# Patient Record
Sex: Female | Born: 1990 | Race: White | Hispanic: No | Smoking: Current every day smoker
Health system: Southern US, Community
[De-identification: ages and names within clinical notes are randomized; demographics above are authoritative.]

## PROBLEM LIST (undated history)

## (undated) DIAGNOSIS — D649 Anemia, unspecified: Secondary | ICD-10-CM

## (undated) HISTORY — PX: INNER EAR SURGERY: SHX679

---
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
## 2012-06-09 ENCOUNTER — Emergency Department: Payer: Self-pay | Admitting: Internal Medicine

## 2014-10-12 ENCOUNTER — Encounter: Payer: Self-pay | Admitting: General Practice

## 2014-10-12 ENCOUNTER — Emergency Department
Admission: EM | Admit: 2014-10-12 | Discharge: 2014-10-12 | Disposition: A | Discharge status: Home / Self Care | Payer: Medicaid Other | Attending: Emergency Medicine | Admitting: Emergency Medicine

## 2014-10-12 ENCOUNTER — Emergency Department: Payer: Medicaid Other

## 2014-10-12 DIAGNOSIS — Z3A01 Less than 8 weeks gestation of pregnancy: Secondary | ICD-10-CM | POA: Diagnosis not present

## 2014-10-12 DIAGNOSIS — O200 Threatened abortion: Secondary | ICD-10-CM | POA: Diagnosis not present

## 2014-10-12 DIAGNOSIS — F17210 Nicotine dependence, cigarettes, uncomplicated: Secondary | ICD-10-CM | POA: Diagnosis not present

## 2014-10-12 DIAGNOSIS — O99331 Smoking (tobacco) complicating pregnancy, first trimester: Secondary | ICD-10-CM | POA: Diagnosis not present

## 2014-10-12 DIAGNOSIS — O209 Hemorrhage in early pregnancy, unspecified: Secondary | ICD-10-CM | POA: Diagnosis present

## 2014-10-12 LAB — CBC
HCT: 40.2 % (ref 35.0–47.0)
Hemoglobin: 13.8 g/dL (ref 12.0–16.0)
MCH: 33.4 pg (ref 26.0–34.0)
MCHC: 34.3 g/dL (ref 32.0–36.0)
MCV: 97.4 fL (ref 80.0–100.0)
PLATELETS: 279 10*3/uL (ref 150–440)
RBC: 4.13 MIL/uL (ref 3.80–5.20)
RDW: 13.2 % (ref 11.5–14.5)
WBC: 10.6 10*3/uL (ref 3.6–11.0)

## 2014-10-12 LAB — POCT PREGNANCY, URINE: PREG TEST UR: POSITIVE — AB

## 2014-10-12 LAB — HCG, QUANTITATIVE, PREGNANCY: hCG, Beta Chain, Quant, S: 2923 m[IU]/mL — ABNORMAL HIGH (ref <5)

## 2014-10-12 NOTE — Discharge Instructions (Signed)

## 2014-10-12 NOTE — ED Notes (Signed)
Pt. Arrived to ed from home with reports of experiencing vaginal bleeding since yesterday morning.  Pt verbalized that she is currently [redacted] weeks pregnant. Pt verbalized experiencing lower abdominal cramping. Pt alert and oriented. Pt describes bleeding as " small blood clot"

## 2014-10-12 NOTE — ED Provider Notes (Signed)
[facility] Emergency Department Provider Note     Time seen: ----------------------------------------- 6:59 PM on 10/12/2014 -----------------------------------------    I have reviewed the triage vital signs and the nursing notes.   HISTORY  Chief Complaint Vaginal Bleeding    HPI Mercedes Potter is a 24 y.o. female who presents ER for vaginal bleeding since this morning. Patient states his ladder then a menstrual cycle. This is her second pregnancy, describes lower abdominal cramping is mild. Nothing makes it better or worse. She states [redacted] weeks pregnant, denied bleeding with her prior pregnancy.   History reviewed. No pertinent past medical history.  There are no active problems to display for this patient.   History reviewed. No pertinent past surgical history.  Allergies Review of patient's allergies indicates no known allergies.  Social History History  Substance Use Topics  . Smoking status: Current Every Day Smoker -- 0.50 packs/day    Types: Cigarettes  . Smokeless tobacco: Not on file  . Alcohol Use: No    Review of Systems Constitutional: Negative for fever. Eyes: Negative for visual changes. ENT: Negative for sore throat. Cardiovascular: Negative for chest pain. Respiratory: Negative for shortness of breath. Gastrointestinal: Positive for abdominal cramping Genitourinary: Negative for dysuria. Positive for light vaginal bleeding Musculoskeletal: Negative for back pain. Skin: Negative for rash. Neurological: Negative for headaches, focal weakness or numbness.  10-point ROS otherwise negative.  ____________________________________________   PHYSICAL EXAM:  VITAL SIGNS: ED Triage Vitals  Enc Vitals Group     BP 10/12/14 1740 125/87 mmHg     Pulse Rate 10/12/14 1740 73     Resp 10/12/14 1740 18     Temp --      Temp src --      SpO2 10/12/14 1740 98 %     Weight 10/12/14 1740 108 lb (48.988 kg)     Height  10/12/14 1740  (1.6 m)     Head Cir --      Peak Flow --      Pain Score 10/12/14 1757 3     Pain Loc --      Pain Edu? --      Excl. in GC? --    Constitutional: Alert and oriented. Well appearing and in no distress. Eyes: Conjunctivae are normal. PERRL. Normal extraocular movements. ENT   Head: Normocephalic and atraumatic.   Nose: No congestion/rhinnorhea.   Mouth/Throat: Mucous membranes are moist.   Neck: No stridor. Hematological/Lymphatic/Immunilogical: No cervical lymphadenopathy. Cardiovascular: Normal rate, regular rhythm. Normal and symmetric distal pulses are present in all extremities. No murmurs, rubs, or gallops. Respiratory: Normal respiratory effort without tachypnea nor retractions. Breath sounds are clear and equal bilaterally. No wheezes/rales/rhonchi. Gastrointestinal: Soft and nontender. No distention. No abdominal bruits. There is no CVA tenderness. Musculoskeletal: Nontender with normal range of motion in all extremities. No joint effusions.  No lower extremity tenderness nor edema. Neurologic:  Normal speech and language. No gross focal neurologic deficits are appreciated. Speech is normal. No gait instability. Skin:  Skin is warm, dry and intact. No rash noted. Psychiatric: Mood and affect are normal. Speech and behavior are normal. Patient exhibits appropriate insight and judgment.  ____________________________________________  ED COURSE:  Pertinent labs & imaging results that were available during my care of the patient were reviewed by me and considered in my medical decision making (see chart for details). We'll check Prevacid labs, ultrasound and reevaluate. ____________________________________________    LABS (pertinent positives/negatives)  Labs Reviewed  HCG,  QUANTITATIVE, PREGNANCY - Abnormal; Notable for the following:    hCG, Beta Chain, Quant, S 2923 (*)    All other components within normal limits  POCT PREGNANCY, URINE -  Abnormal; Notable for the following:    Preg Test, Ur POSITIVE (*)    All other components within normal limits  CBC   Blood type is A+ RADIOLOGY Transvaginal ultrasound IMPRESSION: Findings are suspicious but not yet definitive for failed pregnancy. Recommend follow-up US in 10-14 days for definitive diagnosis. This recommendation follows SRU consensus guidelines: Diagnostic Criteria for Nonviable Pregnancy Early in the First Trimester. Malva Limes Engl J Med 2013; 161:[id number]:1443-51.  ____________________________________________  FINAL ASSESSMENT AND PLAN  Threatened miscarriage  Plan: Patient will continue outpatient follow-up with OB/GYN for reevaluation and repeat ultrasound.. She is advised to return for worsening or worrisome symptoms. She was given proper follow-up precautions. This is likely a failed pregnancy.   Emily FilbertWilliams, Bradyn Vassey E, MD   Emily FilbertJonathan E Tushar Enns, MD 10/12/14 [id number]

## 2015-04-04 ENCOUNTER — Inpatient Hospital Stay (HOSPITAL_COMMUNITY)
Admission: AD | Admit: 2015-04-04 | Discharge: 2015-04-04 | Disposition: A | Discharge status: Home / Self Care | Payer: Medicaid Other | Source: Ambulatory Visit | Attending: Obstetrics & Gynecology | Admitting: Obstetrics & Gynecology

## 2015-04-04 ENCOUNTER — Inpatient Hospital Stay (HOSPITAL_COMMUNITY): Payer: Medicaid Other

## 2015-04-04 ENCOUNTER — Encounter (HOSPITAL_COMMUNITY): Payer: Self-pay

## 2015-04-04 ENCOUNTER — Inpatient Hospital Stay (EMERGENCY_DEPARTMENT_HOSPITAL)
Admission: AD | Admit: 2015-04-04 | Discharge: 2015-04-04 | Disposition: A | Discharge status: Home / Self Care | Payer: Medicaid Other | Source: Ambulatory Visit | Attending: Obstetrics & Gynecology | Admitting: Obstetrics & Gynecology

## 2015-04-04 DIAGNOSIS — O0000 Abdominal pregnancy without intrauterine pregnancy: Secondary | ICD-10-CM | POA: Diagnosis not present

## 2015-04-04 DIAGNOSIS — Z3A01 Less than 8 weeks gestation of pregnancy: Secondary | ICD-10-CM | POA: Insufficient documentation

## 2015-04-04 DIAGNOSIS — O009 Unspecified ectopic pregnancy without intrauterine pregnancy: Secondary | ICD-10-CM

## 2015-04-04 DIAGNOSIS — O26851 Spotting complicating pregnancy, first trimester: Secondary | ICD-10-CM

## 2015-04-04 DIAGNOSIS — O209 Hemorrhage in early pregnancy, unspecified: Secondary | ICD-10-CM | POA: Insufficient documentation

## 2015-04-04 DIAGNOSIS — O3680X0 Pregnancy with inconclusive fetal viability, not applicable or unspecified: Secondary | ICD-10-CM

## 2015-04-04 HISTORY — DX: Anemia, unspecified: D64.9

## 2015-04-04 LAB — WET PREP, GENITAL
Clue Cells Wet Prep HPF POC: NONE SEEN
SPERM: NONE SEEN
Trich, Wet Prep: NONE SEEN
Yeast Wet Prep HPF POC: NONE SEEN

## 2015-04-04 LAB — URINE MICROSCOPIC-ADD ON

## 2015-04-04 LAB — URINALYSIS, ROUTINE W REFLEX MICROSCOPIC
Bilirubin Urine: NEGATIVE
Bilirubin Urine: NEGATIVE
GLUCOSE, UA: NEGATIVE mg/dL
Glucose, UA: NEGATIVE mg/dL
Ketones, ur: NEGATIVE mg/dL
Ketones, ur: 15 mg/dL — AB
LEUKOCYTES UA: NEGATIVE
LEUKOCYTES UA: NEGATIVE
Nitrite: NEGATIVE
Nitrite: NEGATIVE
PH: 6 (ref 5.0–8.0)
PROTEIN: 30 mg/dL — AB
Protein, ur: 30 mg/dL — AB
Specific Gravity, Urine: >1.03 — ABNORMAL HIGH (ref 1.005–1.030)
pH: 6 (ref 5.0–8.0)

## 2015-04-04 LAB — CBC
HCT: 37.7 % (ref 36.0–46.0)
HEMATOCRIT: 37.9 % (ref 36.0–46.0)
HEMOGLOBIN: 12.9 g/dL (ref 12.0–15.0)
Hemoglobin: 12.8 g/dL (ref 12.0–15.0)
MCH: 32.7 pg (ref 26.0–34.0)
MCH: 32.8 pg (ref 26.0–34.0)
MCHC: 34 g/dL (ref 30.0–36.0)
MCHC: 34 g/dL (ref 30.0–36.0)
MCV: 95.9 fL (ref 78.0–100.0)
MCV: 96.7 fL (ref 78.0–100.0)
PLATELETS: 332 10*3/uL (ref 150–400)
Platelets: 315 10*3/uL (ref 150–400)
RBC: 3.95 MIL/uL (ref 3.87–5.11)
RBC: 3.9 MIL/uL (ref 3.87–5.11)
RDW: 12.7 % (ref 11.5–15.5)
RDW: 12.4 % (ref 11.5–15.5)
WBC: 17.3 10*3/uL — ABNORMAL HIGH (ref 4.0–10.5)
WBC: 9.1 10*3/uL (ref 4.0–10.5)

## 2015-04-04 LAB — COMPREHENSIVE METABOLIC PANEL
ALK PHOS: 65 U/L (ref 38–126)
ALT: 17 U/L (ref 14–54)
AST: 20 U/L (ref 15–41)
Albumin: 4.3 g/dL (ref 3.5–5.0)
Anion gap: 8 (ref 5–15)
BILIRUBIN TOTAL: 0.3 mg/dL (ref 0.3–1.2)
BUN: 12 mg/dL (ref 6–20)
CHLORIDE: 102 mmol/L (ref 101–111)
CO2: 27 mmol/L (ref 22–32)
Calcium: 8.9 mg/dL (ref 8.9–10.3)
Creatinine, Ser: 0.56 mg/dL (ref 0.44–1.00)
GFR calc non Af Amer: >60 mL/min (ref >60)
Glucose, Bld: 100 mg/dL — ABNORMAL HIGH (ref 65–99)
Potassium: 3.5 mmol/L (ref 3.5–5.1)
Sodium: 137 mmol/L (ref 135–145)
Total Protein: 7.3 g/dL (ref 6.5–8.1)

## 2015-04-04 LAB — HCG, QUANTITATIVE, PREGNANCY
hCG, Beta Chain, Quant, S: 642 m[IU]/mL — ABNORMAL HIGH (ref <5)
hCG, Beta Chain, Quant, S: 661 m[IU]/mL — ABNORMAL HIGH (ref <5)

## 2015-04-04 LAB — RAPID URINE DRUG SCREEN, HOSP PERFORMED
AMPHETAMINES: NOT DETECTED
BARBITURATES: NOT DETECTED
BENZODIAZEPINES: NOT DETECTED
Cocaine: NOT DETECTED
OPIATES: NOT DETECTED
TETRAHYDROCANNABINOL: POSITIVE — AB

## 2015-04-04 LAB — HIV ANTIBODY (ROUTINE TESTING W REFLEX): HIV SCREEN 4TH GENERATION: NONREACTIVE

## 2015-04-04 LAB — POCT PREGNANCY, URINE: Preg Test, Ur: POSITIVE — AB

## 2015-04-04 LAB — ABO/RH: ABO/RH(D): A POS

## 2015-04-04 MED ORDER — METHOTREXATE INJECTION FOR WOMEN'S HOSPITAL
50.0000 mg/m2 | Freq: Once | INTRAMUSCULAR | Status: AC
  Administered 2015-04-04: 75 mg via INTRAMUSCULAR
  Filled 2015-04-04: qty 1.5

## 2015-04-04 NOTE — Discharge Instructions (Signed)
Methotrexate Treatment for an Ectopic Pregnancy, Care After Refer to this sheet in the next few weeks. These instructions provide you with information on caring for yourself after your procedure. Your health care provider may also give you more specific instructions. Your treatment has been planned according to current medical practices, but problems sometimes occur. Call your health care provider if you have any problems or questions after your procedure. WHAT TO EXPECT AFTER THE PROCEDURE You may have some abdominal cramping, vaginal bleeding, and fatigue in the first few days after taking methotrexate. Some other possible side effects of methotrexate include:  Nausea.  Vomiting.  Diarrhea.  Mouth sores.  Swelling or irritation of the lining of your lungs (pneumonitis).  Liver damage.  Hair loss. HOME CARE INSTRUCTIONS  After you have received the methotrexate medicine, you need to be careful of your activities and watch your condition for several weeks. It may take 1 week before your hormone levels return to normal.  Keep all follow-up appointments as directed by your health care provider.  Avoid traveling too far away from your health care provider.  Do not have sexual intercourse until your health care provider says it is safe to do so.  You may resume your usual diet.  Limit strenuous activity.  Do not take folic acid, prenatal vitamins, or other vitamins that contain folic acid.  Do not take aspirin, ibuprofen, or naproxen (nonsteroidal anti-inflammatory drugs [NSAIDs]).  Do not drink alcohol. SEEK MEDICAL CARE IF:   You cannot control your nausea and vomiting.  You cannot control your diarrhea.  You have sores in your mouth and want treatment.  You need pain medicine for your abdominal pain.  You have a rash.  You are having a reaction to the medicine. SEEK IMMEDIATE MEDICAL CARE IF:   You have increasing abdominal or pelvic pain.  You notice increased  bleeding.  You feel light-headed, or you faint.  You have shortness of breath.  Your heart rate increases.  You have a cough.  You have chills.  You have a fever.   This information is not intended to replace advice given to you by your health care provider. Make sure you discuss any questions you have with your health care provider.   Document Released: 03/23/2011 Document Revised: 04/08/2013 Document Reviewed: 01/20/2013 Elsevier Interactive Patient Education 2016 [employer]   Ectopic Pregnancy An ectopic pregnancy is when the fertilized egg attaches (implants) outside the uterus. Most ectopic pregnancies occur in the fallopian tube. Rarely do ectopic pregnancies occur on the ovary, intestine, pelvis, or cervix. In an ectopic pregnancy, the fertilized egg does not have the ability to develop into a normal, healthy baby.  A ruptured ectopic pregnancy is one in which the fallopian tube gets torn or bursts and results in internal bleeding. Often there is intense abdominal pain, and sometimes, vaginal bleeding. Having an ectopic pregnancy can be life threatening. If left untreated, this dangerous condition can lead to a blood transfusion, abdominal surgery, or even death. CAUSES  Damage to the fallopian tubes is the suspected cause in most ectopic pregnancies.  RISK FACTORS Depending on your circumstances, the risk of having an ectopic pregnancy will vary. The level of risk can be divided into three categories. High Risk  You have gone through infertility treatment.  You have had a previous ectopic pregnancy.  You have had previous tubal surgery.  You have had previous surgery to have the fallopian tubes tied (tubal ligation).  You have tubal problems or  diseases.  You have been exposed to DES. DES is a medicine that was used until 1971 and had effects on babies whose mothers took the medicine.  You become pregnant while using an intrauterine device (IUD) for birth  control. Moderate Risk  You have a history of infertility.  You have a history of a sexually transmitted infection (STI).  You have a history of pelvic inflammatory disease (PID).  You have scarring from endometriosis.  You have multiple sexual partners.  You smoke. Low Risk  You have had previous pelvic surgery.  You use vaginal douching.  You became sexually active before 24 years of age. SIGNS AND SYMPTOMS  An ectopic pregnancy should be suspected in anyone who has missed a period and has abdominal pain or bleeding.  You may experience normal pregnancy symptoms, such as:  Nausea.  Tiredness.  Breast tenderness.  Other symptoms may include:  Pain with intercourse.  Irregular vaginal bleeding or spotting.  Cramping or pain on one side or in the lower abdomen.  Fast heartbeat.  Passing out while having a bowel movement.  Symptoms of a ruptured ectopic pregnancy and internal bleeding may include:  Sudden, severe pain in the abdomen and pelvis.  Dizziness or fainting.  Pain in the shoulder area. DIAGNOSIS  Tests that may be performed include:  A pregnancy test.  An ultrasound test.  Testing the specific level of pregnancy hormone in the bloodstream.  Taking a sample of uterus tissue (dilation and curettage, D&C).  Surgery to perform a visual exam of the inside of the abdomen using a thin, lighted tube with a tiny camera on the end (laparoscope). TREATMENT  An injection of a medicine called methotrexate may be given. This medicine causes the pregnancy tissue to be absorbed. It is given if:  The diagnosis is made early.  The fallopian tube has not ruptured.  You are considered to be a good candidate for the medicine. Usually, pregnancy hormone blood levels are checked after methotrexate treatment. This is to be sure the medicine is effective. It may take 4-6 weeks for the pregnancy to be absorbed (though most pregnancies will be absorbed by 3  weeks). Surgical treatment may be needed. A laparoscope may be used to remove the pregnancy tissue. If severe internal bleeding occurs, a cut (incision) may be made in the lower abdomen (laparotomy), and the ectopic pregnancy is removed. This stops the bleeding. Part of the fallopian tube, or the whole tube, may be removed as well (salpingectomy). After surgery, pregnancy hormone tests may be done to be sure there is no pregnancy tissue left. You may receive a Rho (D) immune globulin shot if you are Rh negative and the father is Rh positive, or if you do not know the Rh type of the father. This is to prevent problems with any future pregnancy. SEEK IMMEDIATE MEDICAL CARE IF:  You have any symptoms of an ectopic pregnancy. This is a medical emergency. MAKE SURE YOU:  Understand these instructions.  Will watch your condition.  Will get help right away if you are not doing well or get worse.   This information is not intended to replace advice given to you by your health care provider. Make sure you discuss any questions you have with your health care provider.   Document Released: 05/11/2004 Document Revised: 04/24/2014 Document Reviewed: 10/31/2012 Elsevier Interactive Patient Education [employer]

## 2015-04-04 NOTE — MAU Provider Note (Signed)
History     CSN: [id number]  Arrival date and time: 04/04/15 2012   First Provider Initiated Contact with Patient 04/04/15 2057      Chief Complaint  Patient presents with  . Vaginal Bleeding  . Abdominal Pain   HPI Comments: Mercedes Potter is a 24 y.o. G3P0011 at [redacted]w[redacted]d who presents today with vaginal bleeding and worsening abdominal pain. She was seen earlier today, and dx with pregnancy of unknown location. She states that when she was here earlier she was having only spotting, but that the bleeding and pain have gotten worse throughout the day.   Vaginal Bleeding The patient's primary symptoms include pelvic pain and vaginal bleeding. This is a new problem. The current episode started yesterday. The problem occurs constantly. The problem has been gradually worsening. The pain is moderate. The problem affects both sides. She is pregnant. Associated symptoms include abdominal pain. Pertinent negatives include no chills, constipation, diarrhea, dysuria, fever, frequency, nausea, urgency or vomiting. The vaginal discharge was bloody. The vaginal bleeding is lighter than menses. She has not been passing clots. She has not been passing tissue. Nothing aggravates the symptoms. She has tried nothing for the symptoms. She is sexually active. She uses nothing for contraception.     Past Medical History  Diagnosis Date  . Anemia     Past Surgical History  Procedure Laterality Date  . Inner ear surgery Right     Family History  Problem Relation Age of Onset  . Hypertension Father     Social History  Substance Use Topics  . Smoking status: Current Every Day Smoker -- 0.50 packs/day    Types: Cigarettes  . Smokeless tobacco: None  . Alcohol Use: Yes     Comment: Wine on 04/04/2015 had home UPT    Allergies:  Allergies  Allergen Reactions  . Latex Itching  . Nickel Rash    Prescriptions prior to admission  Medication Sig Dispense Refill Last Dose  . Prenatal Vit-Fe  Fumarate-FA (PRENATAL MULTIVITAMIN) TABS tablet Take 1 tablet by mouth daily.    Past Week at Unknown time    Review of Systems  Constitutional: Negative for fever and chills.  Gastrointestinal: Positive for abdominal pain. Negative for nausea, vomiting, diarrhea and constipation.  Genitourinary: Positive for vaginal bleeding and pelvic pain. Negative for dysuria, urgency and frequency.   Physical Exam   Blood pressure 106/74, pulse 78, temperature 97.6 F (36.4 C), temperature source Oral, resp. rate 16, height  (1.575 m), weight 48.626 kg (107 lb 3.2 oz), last menstrual period 02/15/2015, SpO2 98 %.  Physical Exam  Nursing note and vitals reviewed. Constitutional: She is oriented to person, place, and time. She appears well-developed and well-nourished. No distress.  HENT:  Head: Normocephalic.  Cardiovascular: Normal rate.   Respiratory: Effort normal.  GI: Soft. There is no tenderness. There is no rebound.  Neurological: She is alert and oriented to person, place, and time.  Skin: Skin is warm and dry.  Psychiatric: She has a normal mood and affect.   Results for orders placed or performed during the hospital encounter of 04/04/15 (from the past 24 hour(s))  Urinalysis, Routine w reflex microscopic (not at [facility])     Status: Abnormal   Collection Time: 04/04/15  8:40 PM  Result Value Ref Range   Color, Urine YELLOW YELLOW   APPearance CLEAR CLEAR   Specific Gravity, Urine >1.030 (H) 1.005 - 1.030   pH 6.0 5.0 - 8.0   Glucose, UA  NEGATIVE NEGATIVE mg/dL   Hgb urine dipstick LARGE (A) NEGATIVE   Bilirubin Urine NEGATIVE NEGATIVE   Ketones, ur 15 (A) NEGATIVE mg/dL   Protein, ur 30 (A) NEGATIVE mg/dL   Nitrite NEGATIVE NEGATIVE   Leukocytes, UA NEGATIVE NEGATIVE  Urine microscopic-add on     Status: Abnormal   Collection Time: 04/04/15  8:40 PM  Result Value Ref Range   Squamous Epithelial / LPF 0-5 (A) NONE SEEN   WBC, UA 6-30 0 - 5 WBC/hpf   RBC / HPF 0-5 0 - 5  RBC/hpf   Bacteria, UA RARE (A) NONE SEEN   Urine-Other MUCOUS PRESENT   hCG, quantitative, pregnancy     Status: Abnormal   Collection Time: 04/04/15  9:04 PM  Result Value Ref Range   hCG, Beta Chain, Quant, S 661 (H) <5 mIU/mL  CBC     Status: None   Collection Time: 04/04/15  9:04 PM  Result Value Ref Range   WBC 9.1 4.0 - 10.5 K/uL   RBC 3.90 3.87 - 5.11 MIL/uL   Hemoglobin 12.8 12.0 - 15.0 g/dL   HCT 81.1 91.4 - 78.2 %   MCV 96.7 78.0 - 100.0 fL   MCH 32.8 26.0 - 34.0 pg   MCHC 34.0 30.0 - 36.0 g/dL   RDW 95.6 21.3 - 08.6 %   Platelets 332 150 - 400 K/uL   US Ob Comp Less 14 Wks  04/04/2015  CLINICAL DATA:  Spotting. Positive urine pregnancy test. Estimated gestational age by LMP is 6 weeks 6 days. Quantitative beta HCG is 642. EXAM: OBSTETRIC <14 WK [place] AND TRANSVAGINAL OB US TECHNIQUE: Both transabdominal and transvaginal ultrasound examinations were performed for complete evaluation of the gestation as well as the maternal uterus, adnexal regions, and pelvic cul-de-sac. Transvaginal technique was performed to assess early pregnancy. COMPARISON:  None. FINDINGS: Intrauterine gestational sac: No definitive intrauterine gestational sac identified. There is a tiny cystic collection located eccentrically within a distended fluid-filled endometrium. Yolk sac:  Not identified. Embryo:  Not identified. Cardiac Activity: Not identified. Maternal uterus/adnexae: Uterus: The uterus is anteverted. The endometrium is expanded with complex fluid or hemorrhage measuring about 3.2 cm in thickness. There is a tiny eccentric cystic collection which could possibly represent an early intrauterine gestational sac but is not definitive for gestational sac. No fluid in the cervix. No myometrial mass lesions. Right ovary: Right ovary measures 3.2 x 1.9 x 1.8 cm Normal size and normal follicular changes. There is a dilated tubular structure in the right adnexa filled with complex fluid with internal echoes.  This suggest study dilated fallopian tube possibly a pyosalpinx or hydrosalpinx. Left ovary: Left ovary measures 2.9 x 2.6 x 2.4 cm. There is an ovarian cyst measuring about 2 cm diameter with prominent surrounding flow on color flow Doppler imaging consistent with a corpus luteal cyst. In the left adnexa, there is a circumscribed ovoid hyperechoic solid-appearing structure measuring 1.1 x 1.8 x 1.1 cm. Small amount of flow is demonstrated around the structure. This could represent an early ectopic pregnancy. No free fluid in the pelvis. IMPRESSION: No definitive intrauterine gestational sac is identified. Heterogeneous probably hemorrhagic fluid collection within the endometrium. Tiny eccentric cystic collection could conceivably represent an early gestational sac. Possible pyosalpinx on the right. Corpus luteal cyst on the left ovary with left adnexal solid appearing structure which could represent early ectopic pregnancy. Suggest follow-up ultrasound in 10-14 days and follow-up with serial beta HCG for further evaluation. These results were  called by telephone at the time of interpretation on 04/04/2015 at 6:18 am to Dr. Georges MouseNATALIE FRAZIER , who verbally acknowledged these results. Electronically Signed   By: Burman NievesWilliam  Stevens M.D.   On: 04/04/2015 06:21   [place] Ob Transvaginal  04/04/2015  CLINICAL DATA:  24 year old pregnant female with spotting and pain. EXAM: OBSTETRIC <14 WK US AND TRANSVAGINAL OB US TECHNIQUE: Transvaginal ultrasound examination was performed for complete evaluation of the gestation as well as the maternal uterus, adnexal regions, and pelvic cul-de-sac. COMPARISON:  Pelvic ultrasound dated earlier on 04/04/2015 FINDINGS: The uterus is anteverted. No intrauterine pregnancy identified. Complex appearing echogenic material within the endometrial canal again noted which may represent hemorrhagic fluid or proteinaceous debris. The right ovary measures 3.5 x 1.5 x 2.1 cm and appears unremarkable. A  tubular appearing structure with intraluminal low-level echogenic debris again noted, which may represent pyosalpinx. The left ovary measures 3.4 x 2.6 x 2.6 cm. A corpus luteum is noted within the left ovary. There is a 2.5 x 1.6 x 1.6 cm solid-appearing echogenic structure adjacent to the left ovary. Doppler images demonstrate presence of internal flow within this structure. This is structures appear to be separate from the left ovary on the cine compression images and is concerning for ectopic pregnancy. No free fluid within the pelvis. IMPRESSION: No intrauterine pregnancy identified. Solid-appearing structure adjacent to the left ovary concerning for an ectopic pregnancy. Clinical correlation and obstetrical consult is advised. Critical Value/emergent results were called by telephone at the time of interpretation on 04/04/2015 at 10:07 pm to nurse practitioner Thressa ShellerHEATHER Towanna Avery , who verbally acknowledged these results. Electronically Signed   By: Elgie CollardArash  Radparvar M.D.   On: 04/04/2015 22:14   [place] Ob Transvaginal  04/04/2015  CLINICAL DATA:  Spotting. Positive urine pregnancy test. Estimated gestational age by LMP is 6 weeks 6 days. Quantitative beta HCG is 642. EXAM: OBSTETRIC <14 WK US AND TRANSVAGINAL OB US TECHNIQUE: Both transabdominal and transvaginal ultrasound examinations were performed for complete evaluation of the gestation as well as the maternal uterus, adnexal regions, and pelvic cul-de-sac. Transvaginal technique was performed to assess early pregnancy. COMPARISON:  None. FINDINGS: Intrauterine gestational sac: No definitive intrauterine gestational sac identified. There is a tiny cystic collection located eccentrically within a distended fluid-filled endometrium. Yolk sac:  Not identified. Embryo:  Not identified. Cardiac Activity: Not identified. Maternal uterus/adnexae: Uterus: The uterus is anteverted. The endometrium is expanded with complex fluid or hemorrhage measuring about 3.2 cm in  thickness. There is a tiny eccentric cystic collection which could possibly represent an early intrauterine gestational sac but is not definitive for gestational sac. No fluid in the cervix. No myometrial mass lesions. Right ovary: Right ovary measures 3.2 x 1.9 x 1.8 cm Normal size and normal follicular changes. There is a dilated tubular structure in the right adnexa filled with complex fluid with internal echoes. This suggest study dilated fallopian tube possibly a pyosalpinx or hydrosalpinx. Left ovary: Left ovary measures 2.9 x 2.6 x 2.4 cm. There is an ovarian cyst measuring about 2 cm diameter with prominent surrounding flow on color flow Doppler imaging consistent with a corpus luteal cyst. In the left adnexa, there is a circumscribed ovoid hyperechoic solid-appearing structure measuring 1.1 x 1.8 x 1.1 cm. Small amount of flow is demonstrated around the structure. This could represent an early ectopic pregnancy. No free fluid in the pelvis. IMPRESSION: No definitive intrauterine gestational sac is identified. Heterogeneous probably hemorrhagic fluid collection within the endometrium. Tiny eccentric cystic  collection could conceivably represent an early gestational sac. Possible pyosalpinx on the right. Corpus luteal cyst on the left ovary with left adnexal solid appearing structure which could represent early ectopic pregnancy. Suggest follow-up ultrasound in 10-14 days and follow-up with serial beta HCG for further evaluation. These results were called by telephone at the time of interpretation on 04/04/2015 at 6:18 am to Dr. Georges Mouse , who verbally acknowledged these results. Electronically Signed   By: Burman Nieves M.D.   On: 04/04/2015 06:21    MAU Course  Procedures  MDM 2219: D/W Dr. Despina Hidden. Will give MTX D/W the patient at length. Reviewed options. Patient agreeable to MTX at this time. Questions answered.   Assessment and Plan   1. EP (ectopic pregnancy)    DC home A POS MTX  given today  Bleeding precautions Ectopic precautions RX: none  Return to MAU as needed FU with OB as planned  Follow-up Information    Follow up with THE [facility] [place] [facility]ERN[facility] ADMISSIONS.   Why:  Wednesday 04/07/15 for repeat blood work   Contact information:   [address] [id number] [place]he[place] Bon[place] [address] [phone]        Tawnya Crook 04/04/2015, 9:11 PM

## 2015-04-04 NOTE — MAU Provider Note (Signed)
History     CSN: [id number]  Arrival date and time: 04/04/15 0401   None     Chief Complaint  Patient presents with  . Dizziness   HPI 24 y.o. G2P0010 at [redacted] weeks EGA w/ reported presyncopal episode. Patient was brought in by EMS. States she was at a friend's house, had some wine, felt nauseous, got up to go to the bathroom and felt hot and dizzy, her partner states she may have fainted for about 10-15 seconds, was fine once she sat down, sat for a few minutes then got up to walk to car and had another possible syncopal episode. Patient states she ate at [facility] about 6 hours prior to episode, no food since, hasn't had much fluid intake for the past day.   Past Medical History  Diagnosis Date  . Anemia     Past Surgical History  Procedure Laterality Date  . Inner ear surgery Right     Family History  Problem Relation Age of Onset  . Hypertension Father     Social History  Substance Use Topics  . Smoking status: Current Every Day Smoker -- 0.50 packs/day    Types: Cigarettes  . Smokeless tobacco: None  . Alcohol Use: Yes     Comment: Wine on 04/04/2015 had home UPT    Allergies:  Allergies  Allergen Reactions  . Nickel Rash    Prescriptions prior to admission  Medication Sig Dispense Refill Last Dose  . Prenatal Vit-Fe Fumarate-FA (PRENATAL MULTIVITAMIN) TABS tablet Take 1 tablet by mouth daily at 12 noon.   04/03/2015 at Unknown time    Review of Systems  Constitutional: Negative.   Respiratory: Negative.   Cardiovascular: Negative.   Gastrointestinal: Positive for nausea. Negative for vomiting, abdominal pain, diarrhea and constipation.  Genitourinary: Negative for dysuria, urgency, frequency, hematuria and flank pain.       + spotting   Musculoskeletal: Negative.   Neurological: Positive for dizziness and loss of consciousness.  Psychiatric/Behavioral: Negative.    Physical Exam   Blood pressure 108/66, pulse 98, temperature 97.5 F (36.4 C),  temperature source Axillary, resp. rate 16, last menstrual period 02/15/2015, SpO2 100 %.  Physical Exam  Nursing note and vitals reviewed. Constitutional: She is oriented to person, place, and time. She appears well-developed. No distress.  Thin   HENT:  Head: Normocephalic and atraumatic.  Cardiovascular: Normal rate.   Respiratory: Effort normal. No respiratory distress.  GI: Soft. Bowel sounds are normal. She exhibits no distension and no mass. There is no tenderness. There is no rebound and no guarding.  Genitourinary: There is no rash or lesion on the right labia. There is no rash or lesion on the left labia. Uterus is not deviated, not enlarged, not fixed and not tender. Cervix exhibits no motion tenderness, no discharge and no friability. Right adnexum displays no mass, no tenderness and no fullness. Left adnexum displays no mass, no tenderness and no fullness. There is bleeding (small) in the vagina. No erythema or tenderness in the vagina. No vaginal discharge found.  Neurological: She is alert and oriented to person, place, and time.  Skin: Skin is warm and dry.  Psychiatric: She has a normal mood and affect.    MAU Course  Procedures  Results for orders placed or performed during the hospital encounter of 04/04/15 (from the past 24 hour(s))  CBC     Status: Abnormal   Collection Time: 04/04/15  4:25 AM  Result Value Ref Range  WBC 17.3 (H) 4.0 - 10.5 K/uL   RBC 3.95 3.87 - 5.11 MIL/uL   Hemoglobin 12.9 12.0 - 15.0 g/dL   HCT 16.1 09.6 - 04.5 %   MCV 95.9 78.0 - 100.0 fL   MCH 32.7 26.0 - 34.0 pg   MCHC 34.0 30.0 - 36.0 g/dL   RDW 40.9 81.1 - 91.4 %   Platelets 315 150 - 400 K/uL  Comprehensive metabolic panel     Status: Abnormal   Collection Time: 04/04/15  4:25 AM  Result Value Ref Range   Sodium 137 135 - 145 mmol/L   Potassium 3.5 3.5 - 5.1 mmol/L   Chloride 102 101 - 111 mmol/L   CO2 27 22 - 32 mmol/L   Glucose, Bld 100 (H) 65 - 99 mg/dL   BUN 12 6 - 20  mg/dL   Creatinine, Ser 7.82 0.44 - 1.00 mg/dL   Calcium 8.9 8.9 - 95.6 mg/dL   Total Protein 7.3 6.5 - 8.1 g/dL   Albumin 4.3 3.5 - 5.0 g/dL   AST 20 15 - 41 U/L   ALT 17 14 - 54 U/L   Alkaline Phosphatase 65 38 - 126 U/L   Total Bilirubin 0.3 0.3 - 1.2 mg/dL   GFR calc non Af Amer >60 >60 mL/min   GFR calc Af Amer >60 >60 mL/min   Anion gap 8 5 - 15  Pregnancy, urine POC     Status: Abnormal   Collection Time: 04/04/15  4:25 AM  Result Value Ref Range   Preg Test, Ur POSITIVE (A) NEGATIVE  Urinalysis, Routine w reflex microscopic (not at [facility])     Status: Abnormal   Collection Time: 04/04/15  4:30 AM  Result Value Ref Range   Color, Urine YELLOW YELLOW   APPearance CLEAR CLEAR   Specific Gravity, Urine >1.030 (H) 1.005 - 1.030   pH 6.0 5.0 - 8.0   Glucose, UA NEGATIVE NEGATIVE mg/dL   Hgb urine dipstick LARGE (A) NEGATIVE   Bilirubin Urine NEGATIVE NEGATIVE   Ketones, ur NEGATIVE NEGATIVE mg/dL   Protein, ur 30 (A) NEGATIVE mg/dL   Nitrite NEGATIVE NEGATIVE   Leukocytes, UA NEGATIVE NEGATIVE  Urine rapid drug screen (hosp performed)     Status: Abnormal   Collection Time: 04/04/15  4:30 AM  Result Value Ref Range   Opiates NONE DETECTED NONE DETECTED   Cocaine NONE DETECTED NONE DETECTED   Benzodiazepines NONE DETECTED NONE DETECTED   Amphetamines NONE DETECTED NONE DETECTED   Tetrahydrocannabinol POSITIVE (A) NONE DETECTED   Barbiturates NONE DETECTED NONE DETECTED  Urine microscopic-add on     Status: Abnormal   Collection Time: 04/04/15  4:30 AM  Result Value Ref Range   Squamous Epithelial / LPF 0-5 (A) NONE SEEN   WBC, UA 0-5 0 - 5 WBC/hpf   RBC / HPF 0-5 0 - 5 RBC/hpf   Bacteria, UA RARE (A) NONE SEEN   Urine-Other MUCOUS PRESENT   Wet prep, genital     Status: Abnormal   Collection Time: 04/04/15  4:58 AM  Result Value Ref Range   Yeast Wet Prep HPF POC NONE SEEN NONE SEEN   Trich, Wet Prep NONE SEEN NONE SEEN   Clue Cells Wet Prep HPF POC NONE SEEN NONE  SEEN   WBC, Wet Prep HPF POC FEW (A) NONE SEEN   Sperm NONE SEEN   hCG, quantitative, pregnancy     Status: Abnormal   Collection Time: 04/04/15  5:00 AM  Result Value Ref Range   hCG, Beta Chain, Quant, S 642 (H) <5 mIU/mL  ABO/Rh     Status: None (Preliminary result)   Collection Time: 04/04/15  5:00 AM  Result Value Ref Range   ABO/RH(D) A POS    [place] Ob Comp Less 14 Wks  04/04/2015  CLINICAL DATA:  Spotting. Positive urine pregnancy test. Estimated gestational age by LMP is 6 weeks 6 days. Quantitative beta HCG is 642. EXAM: OBSTETRIC <14 WK US AND TRANSVAGINAL OB US TECHNIQUE: Both transabdominal and transvaginal ultrasound examinations were performed for complete evaluation of the gestation as well as the maternal uterus, adnexal regions, and pelvic cul-de-sac. Transvaginal technique was performed to assess early pregnancy. COMPARISON:  None. FINDINGS: Intrauterine gestational sac: No definitive intrauterine gestational sac identified. There is a tiny cystic collection located eccentrically within a distended fluid-filled endometrium. Yolk sac:  Not identified. Embryo:  Not identified. Cardiac Activity: Not identified. Maternal uterus/adnexae: Uterus: The uterus is anteverted. The endometrium is expanded with complex fluid or hemorrhage measuring about 3.2 cm in thickness. There is a tiny eccentric cystic collection which could possibly represent an early intrauterine gestational sac but is not definitive for gestational sac. No fluid in the cervix. No myometrial mass lesions. Right ovary: Right ovary measures 3.2 x 1.9 x 1.8 cm Normal size and normal follicular changes. There is a dilated tubular structure in the right adnexa filled with complex fluid with internal echoes. This suggest study dilated fallopian tube possibly a pyosalpinx or hydrosalpinx. Left ovary: Left ovary measures 2.9 x 2.6 x 2.4 cm. There is an ovarian cyst measuring about 2 cm diameter with prominent surrounding flow on color  flow Doppler imaging consistent with a corpus luteal cyst. In the left adnexa, there is a circumscribed ovoid hyperechoic solid-appearing structure measuring 1.1 x 1.8 x 1.1 cm. Small amount of flow is demonstrated around the structure. This could represent an early ectopic pregnancy. No free fluid in the pelvis. IMPRESSION: No definitive intrauterine gestational sac is identified. Heterogeneous probably hemorrhagic fluid collection within the endometrium. Tiny eccentric cystic collection could conceivably represent an early gestational sac. Possible pyosalpinx on the right. Corpus luteal cyst on the left ovary with left adnexal solid appearing structure which could represent early ectopic pregnancy. Suggest follow-up ultrasound in 10-14 days and follow-up with serial beta HCG for further evaluation. These results were called by telephone at the time of interpretation on 04/04/2015 at 6:18 am to Dr. Georges MouseNATALIE Melinda Gwinner , who verbally acknowledged these results. Electronically Signed   By: Burman NievesWilliam  Stevens M.D.   On: 04/04/2015 06:21   [place] Ob Transvaginal  04/04/2015  CLINICAL DATA:  Spotting. Positive urine pregnancy test. Estimated gestational age by LMP is 6 weeks 6 days. Quantitative beta HCG is 642. EXAM: OBSTETRIC <14 WK US AND TRANSVAGINAL OB US TECHNIQUE: Both transabdominal and transvaginal ultrasound examinations were performed for complete evaluation of the gestation as well as the maternal uterus, adnexal regions, and pelvic cul-de-sac. Transvaginal technique was performed to assess early pregnancy. COMPARISON:  None. FINDINGS: Intrauterine gestational sac: No definitive intrauterine gestational sac identified. There is a tiny cystic collection located eccentrically within a distended fluid-filled endometrium. Yolk sac:  Not identified. Embryo:  Not identified. Cardiac Activity: Not identified. Maternal uterus/adnexae: Uterus: The uterus is anteverted. The endometrium is expanded with complex fluid or  hemorrhage measuring about 3.2 cm in thickness. There is a tiny eccentric cystic collection which could possibly represent an early intrauterine gestational sac but is not definitive for  gestational sac. No fluid in the cervix. No myometrial mass lesions. Right ovary: Right ovary measures 3.2 x 1.9 x 1.8 cm Normal size and normal follicular changes. There is a dilated tubular structure in the right adnexa filled with complex fluid with internal echoes. This suggest study dilated fallopian tube possibly a pyosalpinx or hydrosalpinx. Left ovary: Left ovary measures 2.9 x 2.6 x 2.4 cm. There is an ovarian cyst measuring about 2 cm diameter with prominent surrounding flow on color flow Doppler imaging consistent with a corpus luteal cyst. In the left adnexa, there is a circumscribed ovoid hyperechoic solid-appearing structure measuring 1.1 x 1.8 x 1.1 cm. Small amount of flow is demonstrated around the structure. This could represent an early ectopic pregnancy. No free fluid in the pelvis. IMPRESSION: No definitive intrauterine gestational sac is identified. Heterogeneous probably hemorrhagic fluid collection within the endometrium. Tiny eccentric cystic collection could conceivably represent an early gestational sac. Possible pyosalpinx on the right. Corpus luteal cyst on the left ovary with left adnexal solid appearing structure which could represent early ectopic pregnancy. Suggest follow-up ultrasound in 10-14 days and follow-up with serial beta HCG for further evaluation. These results were called by telephone at the time of interpretation on 04/04/2015 at 6:18 am to Dr. Georges Mouse , who verbally acknowledged these results. Electronically Signed   By: Burman Nieves M.D.   On: 04/04/2015 06:21     Assessment and Plan   1. Pregnancy of unknown anatomic location   2. Spotting affecting pregnancy in first trimester   U/S suspicious for left ectopic pregnancy, but not definitive. Consult w/ Dr. Debroah Loop,  patient ok to follow up in clinic in 48 hours for repeat quant. Precautions rev'd, advised pelvic rest, return immediately w/ increased pain or bleeding.     Medication List    TAKE these medications        prenatal multivitamin Tabs tablet  Take 1 tablet by mouth daily at 12 noon.            Follow-up Information    Follow up with [facility] On 04/06/2015.   Specialty:  Obstetrics and Gynecology   Why:  between 8 and 11 AM for bloodwork   Contact information:   [address] [place] [address] [phone]        Mairany Bruno 04/04/2015, 6:52 AM

## 2015-04-04 NOTE — MAU Note (Signed)
Home pregnancy test positive a week ago.  Felt hot, flushed earlier, felt dizzy.  Went outside to get air and then fainted, boyfriend caught me.  Spotting between 5-7 pm.

## 2015-04-04 NOTE — MAU Note (Signed)
Pt states she started having LLQ pain and bleeding around 7pm tonight. Was told last night here in MAU that she could have a possible ectopic pregnancy. States that the pain is worse now-rates 5/10. Did not take anything for it. States that now, it is not hurting as bad as before she got here. Was having some vaginal bleeding last, that she says is a little worse now, but is only when she wipes. Not wearing pad. Did not pass any clots or tissue.

## 2015-04-05 LAB — GC/CHLAMYDIA PROBE AMP (~~LOC~~) NOT AT ARMC
Chlamydia: NEGATIVE
Neisseria Gonorrhea: NEGATIVE

## 2015-04-08 ENCOUNTER — Inpatient Hospital Stay (HOSPITAL_COMMUNITY)
Admission: AD | Admit: 2015-04-08 | Discharge: 2015-04-08 | Disposition: A | Discharge status: Home / Self Care | Payer: Medicaid Other | Source: Ambulatory Visit | Attending: Family Medicine | Admitting: Family Medicine

## 2015-04-08 DIAGNOSIS — O009 Unspecified ectopic pregnancy without intrauterine pregnancy: Secondary | ICD-10-CM | POA: Diagnosis not present

## 2015-04-08 DIAGNOSIS — Z3A01 Less than 8 weeks gestation of pregnancy: Secondary | ICD-10-CM | POA: Diagnosis not present

## 2015-04-08 LAB — HCG, QUANTITATIVE, PREGNANCY: hCG, Beta Chain, Quant, S: 863 m[IU]/mL — ABNORMAL HIGH (ref <5)

## 2015-04-08 NOTE — MAU Note (Signed)
Pt presents to MAU for repeat BHCG, ectopic pregnancy, received methotrexate on Dec 18th. Denies any pain, reports vaginal spotting

## 2015-04-08 NOTE — MAU Provider Note (Signed)
S:  Mercedes Potter is a 24 y.o. female G3P0011 at 483w3d presenting to MAU for Day #4 beta hcg level after receiving methotrexate for a left adnexal mass. The patient was seen on 12/18 for abdominal pain and vaginal bleeding and returned later that day for worsening pain and bleeding.   US showed left adnexal mass.  Currently the patient says she feels great. She denies pain or bleeding at this time.    O:  GENERAL: Well-developed, well-nourished female in no acute distress.  LUNGS: Effort normal SKIN: Warm, dry and without erythema ABDOMEN: soft, non-tender  PSYCH: Normal mood and affect  Filed Vitals:   04/08/15 1319  BP: 103/72  Pulse: 70  Temp: 98 F (36.7 C)  Resp: 18   Results for orders placed or performed during the hospital encounter of 04/08/15 (from the past 48 hour(s))  hCG, quantitative, pregnancy     Status: Abnormal   Collection Time: 04/08/15 12:33 PM  Result Value Ref Range   hCG, Beta Chain, Quant, S 863 (H) <5 mIU/mL    Comment:          GEST. AGE      CONC.  (mIU/mL)   <=1 WEEK        5 - 50     2 WEEKS       50 - 500     3 WEEKS       100 - 10,000     4 WEEKS     1,000 - 30,000     5 WEEKS     3,500 - 115,000   6-8 WEEKS     12,000 - 270,000    12 WEEKS     15,000 - 220,000        FEMALE AND NON-PREGNANT FEMALE:     LESS THAN 5 mIU/mL [facility]      MDM:  Quant 12/18: 661 MTX given Quant 12/22: 863 Day # 4   A:  1. Ectopic pregnancy    P:  Discharge home in stable condition Strict ectopic precautions Pelvic rest Return on Sunday for day #7 labs, or sooner if symptoms worsen   Duane LopeJennifer I Rasch, NP 04/08/2015 2:27 PM

## 2015-04-08 NOTE — Discharge Instructions (Signed)
Ectopic Pregnancy °An ectopic pregnancy happens when a fertilized egg grows outside the uterus. A pregnancy cannot live outside of the uterus. This problem often happens in the fallopian tube. It is often caused by damage to the fallopian tube. °If this problem is found early, you may be treated with medicine. If your tube tears or bursts open (ruptures), you will bleed inside. This is an emergency. You will need surgery. Get help right away.  °SYMPTOMS °You may have normal pregnancy symptoms at first. These include: °· Missing your period. °· Feeling sick to your stomach (nauseous). °· Being tired. °· Having tender breasts. °Then, you may start to have symptoms that are not normal. These include: °· Pain with sex (intercourse). °· Bleeding from the vagina. This includes light bleeding (spotting). °· Belly (abdomen) or lower belly cramping or pain. This may be felt on one side. °· A fast heartbeat (pulse). °· Passing out (fainting) after going poop (bowel movement). °If your tube tears, you may have symptoms such as: °· Really bad pain in the belly or lower belly. This happens suddenly. °· Dizziness. °· Passing out. °· Shoulder pain. °GET HELP RIGHT AWAY IF:  °You have any of these symptoms. This is an emergency. °MAKE SURE YOU: °· Understand these instructions. °· Will watch your condition. °· Will get help right away if you are not doing well or get worse. °  °This information is not intended to replace advice given to you by your health care provider. Make sure you discuss any questions you have with your health care provider. °  °Document Released: 06/30/2008 Document Revised: 04/08/2013 Document Reviewed: 11/13/2012 °Elsevier Interactive Patient Education ©2016 [employer] ° °Methotrexate Treatment for an Ectopic Pregnancy, Care After °Refer to this sheet in the next few weeks. These instructions provide you with information on caring for yourself after your procedure. Your health care provider may also give  you more specific instructions. Your treatment has been planned according to current medical practices, but problems sometimes occur. Call your health care provider if you have any problems or questions after your procedure. °WHAT TO EXPECT AFTER THE PROCEDURE °You may have some abdominal cramping, vaginal bleeding, and fatigue in the first few days after taking methotrexate. Some other possible side effects of methotrexate include: °· Nausea. °· Vomiting. °· Diarrhea. °· Mouth sores. °· Swelling or irritation of the lining of your lungs (pneumonitis). °· Liver damage. °· Hair loss. °HOME CARE INSTRUCTIONS  °After you have received the methotrexate medicine, you need to be careful of your activities and watch your condition for several weeks. It may take 1 week before your hormone levels return to normal. °· Keep all follow-up appointments as directed by your health care provider. °· Avoid traveling too far away from your health care provider. °· Do not have sexual intercourse until your health care provider says it is safe to do so. °· You may resume your usual diet. °· Limit strenuous activity. °· Do not take folic acid, prenatal vitamins, or other vitamins that contain folic acid. °· Do not take aspirin, ibuprofen, or naproxen (nonsteroidal anti-inflammatory drugs [NSAIDs]). °· Do not drink alcohol. °SEEK MEDICAL CARE IF:  °· You cannot control your nausea and vomiting. °· You cannot control your diarrhea. °· You have sores in your mouth and want treatment. °· You need pain medicine for your abdominal pain. °· You have a rash. °· You are having a reaction to the medicine. °SEEK IMMEDIATE MEDICAL CARE IF:  °· You have   increasing abdominal or pelvic pain. °· You notice increased bleeding. °· You feel light-headed, or you faint. °· You have shortness of breath. °· Your heart rate increases. °· You have a cough. °· You have chills. °· You have a fever. °  °This information is not intended to replace advice given to  you by your health care provider. Make sure you discuss any questions you have with your health care provider. °  °Document Released: 03/23/2011 Document Revised: 04/08/2013 Document Reviewed: 01/20/2013 °Elsevier Interactive Patient Education ©2016 [employer] ° °

## 2015-04-12 ENCOUNTER — Inpatient Hospital Stay (HOSPITAL_COMMUNITY)
Admission: AD | Admit: 2015-04-12 | Discharge: 2015-04-12 | Disposition: A | Discharge status: Home / Self Care | Payer: Medicaid Other | Source: Ambulatory Visit | Attending: Obstetrics and Gynecology | Admitting: Obstetrics and Gynecology

## 2015-04-12 DIAGNOSIS — O99331 Smoking (tobacco) complicating pregnancy, first trimester: Secondary | ICD-10-CM | POA: Insufficient documentation

## 2015-04-12 DIAGNOSIS — O009 Unspecified ectopic pregnancy without intrauterine pregnancy: Secondary | ICD-10-CM | POA: Diagnosis not present

## 2015-04-12 DIAGNOSIS — Z3A08 8 weeks gestation of pregnancy: Secondary | ICD-10-CM | POA: Diagnosis not present

## 2015-04-12 DIAGNOSIS — F17200 Nicotine dependence, unspecified, uncomplicated: Secondary | ICD-10-CM | POA: Diagnosis not present

## 2015-04-12 DIAGNOSIS — Z8249 Family history of ischemic heart disease and other diseases of the circulatory system: Secondary | ICD-10-CM | POA: Insufficient documentation

## 2015-04-12 LAB — COMPREHENSIVE METABOLIC PANEL
ALBUMIN: 4.1 g/dL (ref 3.5–5.0)
ALT: 16 U/L (ref 14–54)
AST: 16 U/L (ref 15–41)
Alkaline Phosphatase: 64 U/L (ref 38–126)
Anion gap: 9 (ref 5–15)
BUN: 12 mg/dL (ref 6–20)
CHLORIDE: 101 mmol/L (ref 101–111)
CO2: 29 mmol/L (ref 22–32)
CREATININE: 0.68 mg/dL (ref 0.44–1.00)
Calcium: 9.5 mg/dL (ref 8.9–10.3)
GFR calc non Af Amer: >60 mL/min (ref >60)
GLUCOSE: 116 mg/dL — AB (ref 65–99)
Potassium: 3.9 mmol/L (ref 3.5–5.1)
SODIUM: 139 mmol/L (ref 135–145)
Total Bilirubin: 0.3 mg/dL (ref 0.3–1.2)
Total Protein: 7.1 g/dL (ref 6.5–8.1)

## 2015-04-12 LAB — HCG, QUANTITATIVE, PREGNANCY: HCG, BETA CHAIN, QUANT, S: 131 m[IU]/mL — AB (ref <5)

## 2015-04-12 NOTE — MAU Provider Note (Signed)
MAU HISTORY AND PHYSICAL  Chief Complaint:  Follow-up   Mercedes Potter is a 24 y.o.  [id number] with IUP at [redacted]w[redacted]d presenting for f/u hcg  Dx with ectopic 12/18. HCG that day 661. Methotrexate given x2. F/u on 12/22 863. Today presents one day late for hcg. Denies pain. Bleeding is small.   Past Medical History  Diagnosis Date  . Anemia     Past Surgical History  Procedure Laterality Date  . Inner ear surgery Right     Family History  Problem Relation Age of Onset  . Hypertension Father     Social History  Substance Use Topics  . Smoking status: Current Every Day Smoker -- 0.50 packs/day    Types: Cigarettes  . Smokeless tobacco: Not on file  . Alcohol Use: Yes     Comment: Wine on 04/04/2015 had home UPT    Allergies  Allergen Reactions  . Latex Itching  . Nickel Rash    Prescriptions prior to admission  Medication Sig Dispense Refill Last Dose  . Prenatal Vit-Fe Fumarate-FA (PRENATAL MULTIVITAMIN) TABS tablet Take 1 tablet by mouth daily.    Past Week at Unknown time    Review of Systems - Negative except for what is mentioned in HPI.  Physical Exam  Blood pressure 106/63, pulse 87, temperature 98.3 F (36.8 C), temperature source Oral, resp. rate 16, last menstrual period 02/15/2015. GENERAL: Well-developed, well-nourished female in no acute distress.  LUNGS: Clear to auscultation bilaterally.  HEART: Regular rate and rhythm. ABDOMEN: Soft, nontender, nondistended, gravid.    Labs: Results for orders placed or performed during the hospital encounter of 04/12/15 (from the past 24 hour(s))  hCG, quantitative, pregnancy   Collection Time: 04/12/15  3:28 PM  Result Value Ref Range   hCG, Beta Chain, Quant, S 131 (H) <5 mIU/mL  Comprehensive metabolic panel   Collection Time: 04/12/15  3:28 PM  Result Value Ref Range   Sodium 139 135 - 145 mmol/L   Potassium 3.9 3.5 - 5.1 mmol/L   Chloride 101 101 - 111 mmol/L   CO2 29 22 - 32 mmol/L   Glucose, Bld 116  (H) 65 - 99 mg/dL   BUN 12 6 - 20 mg/dL   Creatinine, Ser 4.54 0.44 - 1.00 mg/dL   Calcium 9.5 8.9 - 09.8 mg/dL   Total Protein 7.1 6.5 - 8.1 g/dL   Albumin 4.1 3.5 - 5.0 g/dL   AST 16 15 - 41 U/L   ALT 16 14 - 54 U/L   Alkaline Phosphatase 64 38 - 126 U/L   Total Bilirubin 0.3 0.3 - 1.2 mg/dL   GFR calc non Af Amer >60 >60 mL/min   GFR calc Af Amer >60 >60 mL/min   Anion gap 9 5 - 15    Imaging Studies:  US Ob Comp Less 14 Wks  04/04/2015  CLINICAL DATA:  Spotting. Positive urine pregnancy test. Estimated gestational age by LMP is 6 weeks 6 days. Quantitative beta HCG is 642. EXAM: OBSTETRIC <14 WK [place] AND TRANSVAGINAL OB US TECHNIQUE: Both transabdominal and transvaginal ultrasound examinations were performed for complete evaluation of the gestation as well as the maternal uterus, adnexal regions, and pelvic cul-de-sac. Transvaginal technique was performed to assess early pregnancy. COMPARISON:  None. FINDINGS: Intrauterine gestational sac: No definitive intrauterine gestational sac identified. There is a tiny cystic collection located eccentrically within a distended fluid-filled endometrium. Yolk sac:  Not identified. Embryo:  Not identified. Cardiac Activity: Not identified. Maternal uterus/adnexae: Uterus:  The uterus is anteverted. The endometrium is expanded with complex fluid or hemorrhage measuring about 3.2 cm in thickness. There is a tiny eccentric cystic collection which could possibly represent an early intrauterine gestational sac but is not definitive for gestational sac. No fluid in the cervix. No myometrial mass lesions. Right ovary: Right ovary measures 3.2 x 1.9 x 1.8 cm Normal size and normal follicular changes. There is a dilated tubular structure in the right adnexa filled with complex fluid with internal echoes. This suggest study dilated fallopian tube possibly a pyosalpinx or hydrosalpinx. Left ovary: Left ovary measures 2.9 x 2.6 x 2.4 cm. There is an ovarian cyst measuring  about 2 cm diameter with prominent surrounding flow on color flow Doppler imaging consistent with a corpus luteal cyst. In the left adnexa, there is a circumscribed ovoid hyperechoic solid-appearing structure measuring 1.1 x 1.8 x 1.1 cm. Small amount of flow is demonstrated around the structure. This could represent an early ectopic pregnancy. No free fluid in the pelvis. IMPRESSION: No definitive intrauterine gestational sac is identified. Heterogeneous probably hemorrhagic fluid collection within the endometrium. Tiny eccentric cystic collection could conceivably represent an early gestational sac. Possible pyosalpinx on the right. Corpus luteal cyst on the left ovary with left adnexal solid appearing structure which could represent early ectopic pregnancy. Suggest follow-up ultrasound in 10-14 days and follow-up with serial beta HCG for further evaluation. These results were called by telephone at the time of interpretation on 04/04/2015 at 6:18 am to Dr. Georges Mouse , who verbally acknowledged these results. Electronically Signed   By: Burman Nieves M.D.   On: 04/04/2015 06:21   US Ob Transvaginal  04/04/2015  CLINICAL DATA:  24 year old pregnant female with spotting and pain. EXAM: OBSTETRIC <14 WK [place] AND TRANSVAGINAL OB US TECHNIQUE: Transvaginal ultrasound examination was performed for complete evaluation of the gestation as well as the maternal uterus, adnexal regions, and pelvic cul-de-sac. COMPARISON:  Pelvic ultrasound dated earlier on 04/04/2015 FINDINGS: The uterus is anteverted. No intrauterine pregnancy identified. Complex appearing echogenic material within the endometrial canal again noted which may represent hemorrhagic fluid or proteinaceous debris. The right ovary measures 3.5 x 1.5 x 2.1 cm and appears unremarkable. A tubular appearing structure with intraluminal low-level echogenic debris again noted, which may represent pyosalpinx. The left ovary measures 3.4 x 2.6 x 2.6 cm. A  corpus luteum is noted within the left ovary. There is a 2.5 x 1.6 x 1.6 cm solid-appearing echogenic structure adjacent to the left ovary. Doppler images demonstrate presence of internal flow within this structure. This is structures appear to be separate from the left ovary on the cine compression images and is concerning for ectopic pregnancy. No free fluid within the pelvis. IMPRESSION: No intrauterine pregnancy identified. Solid-appearing structure adjacent to the left ovary concerning for an ectopic pregnancy. Clinical correlation and obstetrical consult is advised. Critical Value/emergent results were called by telephone at the time of interpretation on 04/04/2015 at 10:07 pm to nurse practitioner Thressa Sheller , who verbally acknowledged these results. Electronically Signed   By: Elgie Collard M.D.   On: 04/04/2015 22:14   US Ob Transvaginal  04/04/2015  CLINICAL DATA:  Spotting. Positive urine pregnancy test. Estimated gestational age by LMP is 6 weeks 6 days. Quantitative beta HCG is 642. EXAM: OBSTETRIC <14 WK [place] AND TRANSVAGINAL OB US TECHNIQUE: Both transabdominal and transvaginal ultrasound examinations were performed for complete evaluation of the gestation as well as the maternal uterus, adnexal regions, and pelvic cul-de-sac.  Transvaginal technique was performed to assess early pregnancy. COMPARISON:  None. FINDINGS: Intrauterine gestational sac: No definitive intrauterine gestational sac identified. There is a tiny cystic collection located eccentrically within a distended fluid-filled endometrium. Yolk sac:  Not identified. Embryo:  Not identified. Cardiac Activity: Not identified. Maternal uterus/adnexae: Uterus: The uterus is anteverted. The endometrium is expanded with complex fluid or hemorrhage measuring about 3.2 cm in thickness. There is a tiny eccentric cystic collection which could possibly represent an early intrauterine gestational sac but is not definitive for gestational sac.  No fluid in the cervix. No myometrial mass lesions. Right ovary: Right ovary measures 3.2 x 1.9 x 1.8 cm Normal size and normal follicular changes. There is a dilated tubular structure in the right adnexa filled with complex fluid with internal echoes. This suggest study dilated fallopian tube possibly a pyosalpinx or hydrosalpinx. Left ovary: Left ovary measures 2.9 x 2.6 x 2.4 cm. There is an ovarian cyst measuring about 2 cm diameter with prominent surrounding flow on color flow Doppler imaging consistent with a corpus luteal cyst. In the left adnexa, there is a circumscribed ovoid hyperechoic solid-appearing structure measuring 1.1 x 1.8 x 1.1 cm. Small amount of flow is demonstrated around the structure. This could represent an early ectopic pregnancy. No free fluid in the pelvis. IMPRESSION: No definitive intrauterine gestational sac is identified. Heterogeneous probably hemorrhagic fluid collection within the endometrium. Tiny eccentric cystic collection could conceivably represent an early gestational sac. Possible pyosalpinx on the right. Corpus luteal cyst on the left ovary with left adnexal solid appearing structure which could represent early ectopic pregnancy. Suggest follow-up ultrasound in 10-14 days and follow-up with serial beta HCG for further evaluation. These results were called by telephone at the time of interpretation on 04/04/2015 at 6:18 am to Dr. Georges MouseNATALIE FRAZIER , who verbally acknowledged these results. Electronically Signed   By: Burman NievesWilliam  Stevens M.D.   On: 04/04/2015 06:21    Assessment: Mercedes Potter is  24 y.o. G3P0011 at 3841w0d presents for f/u ectopic, day 8 (arrived one day late) hcg shows appropriate decline, and patient is w/u symptoms of rupture.   Plan: - ectopic rupture return precautions - return 7 days for repeat hcg - cmp obtained today, no kidney or liver abnormalities - sun, folate, vaginal precautions given  Mercedes Potter 12/26/20164:48 PM

## 2015-04-12 NOTE — MAU Note (Addendum)
Spotting has picked up, still light.  Had some pain in left flank yesterday, none now. Did not come yesterday because " it was Christmas, didn't think we were open"

## 2015-04-12 NOTE — Discharge Instructions (Signed)
Ectopic Pregnancy °An ectopic pregnancy happens when a fertilized egg grows outside the uterus. A pregnancy cannot live outside of the uterus. This problem often happens in the fallopian tube. It is often caused by damage to the fallopian tube. °If this problem is found early, you may be treated with medicine. If your tube tears or bursts open (ruptures), you will bleed inside. This is an emergency. You will need surgery. Get help right away.  °SYMPTOMS °You may have normal pregnancy symptoms at first. These include: °· Missing your period. °· Feeling sick to your stomach (nauseous). °· Being tired. °· Having tender breasts. °Then, you may start to have symptoms that are not normal. These include: °· Pain with sex (intercourse). °· Bleeding from the vagina. This includes light bleeding (spotting). °· Belly (abdomen) or lower belly cramping or pain. This may be felt on one side. °· A fast heartbeat (pulse). °· Passing out (fainting) after going poop (bowel movement). °If your tube tears, you may have symptoms such as: °· Really bad pain in the belly or lower belly. This happens suddenly. °· Dizziness. °· Passing out. °· Shoulder pain. °GET HELP RIGHT AWAY IF:  °You have any of these symptoms. This is an emergency. °MAKE SURE YOU: °· Understand these instructions. °· Will watch your condition. °· Will get help right away if you are not doing well or get worse. °  °This information is not intended to replace advice given to you by your health care provider. Make sure you discuss any questions you have with your health care provider. °  °Document Released: 06/30/2008 Document Revised: 04/08/2013 Document Reviewed: 11/13/2012 °Elsevier Interactive Patient Education ©2016 [employer] ° °

## 2015-12-12 IMAGING — US US OB COMP LESS 14 WK
1 series · 14 of 28 positions shown · non-contrast
Comparison: None.

CLINICAL DATA: Pregnant patient with severe cramping. Vaginal
bleeding for 2 days. Quantitative HCG 2,923

EXAM:
OBSTETRIC <14 WK US AND TRANSVAGINAL OB US
TECHNIQUE: Both transabdominal and transvaginal ultrasound examinations were
performed for complete evaluation of the gestation as well as the
maternal uterus, adnexal regions, and pelvic cul-de-sac.
Transvaginal technique was performed to assess early pregnancy.

[Series 1: us ob comp less 14 wk · 0.17mm/px · 14 of 102 slices shown]
[im 4/102]
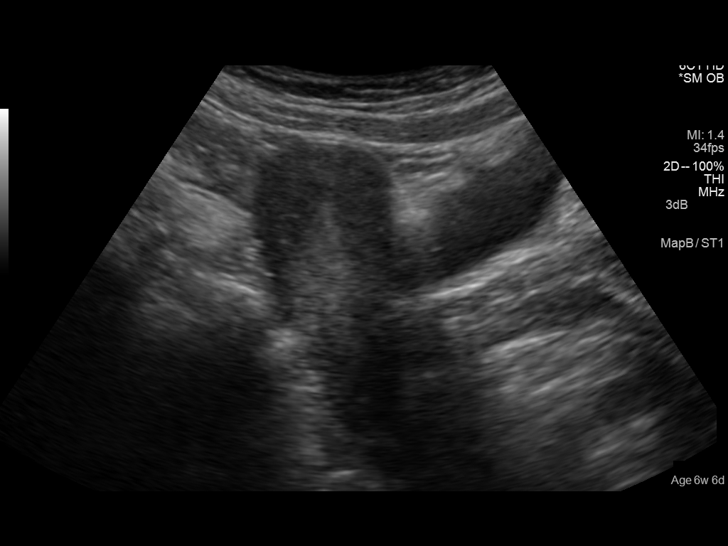
[im 12/102]
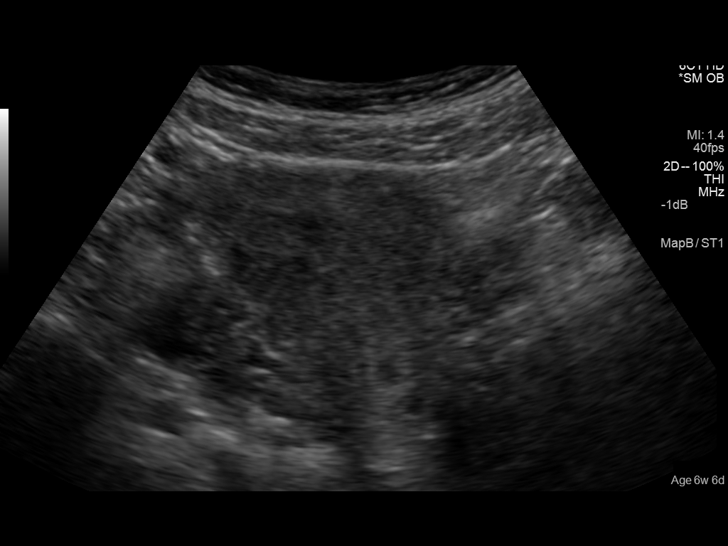
[im 19/102]
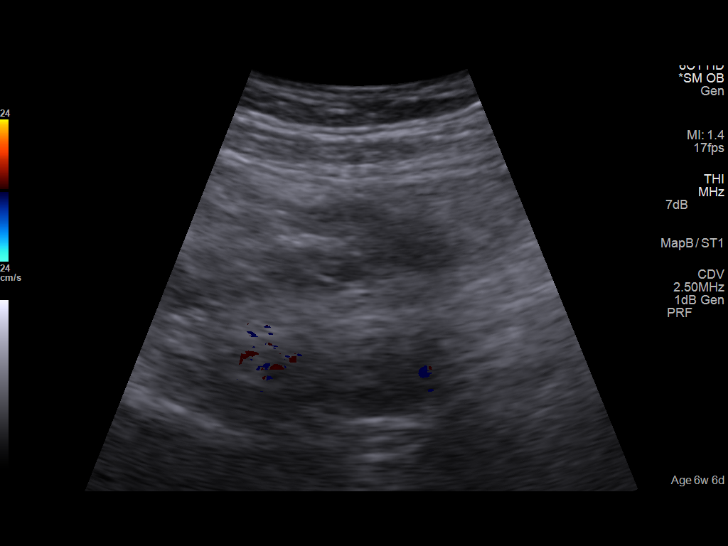
[im 27/102]
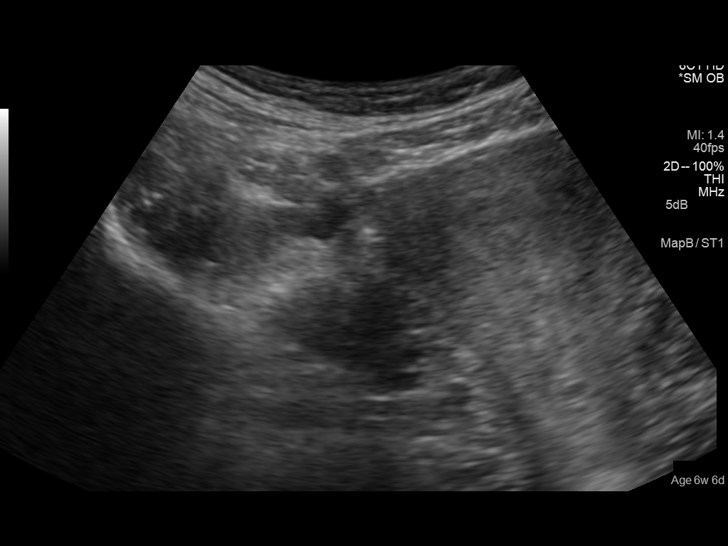
[im 34/102]
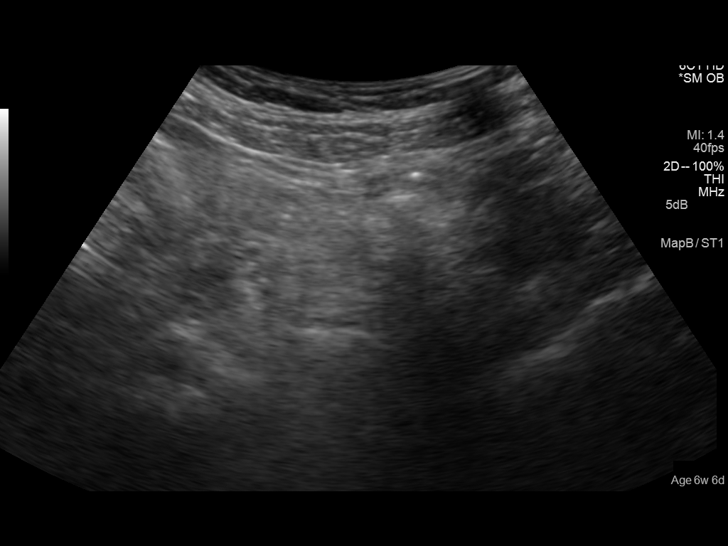
[im 42/102]
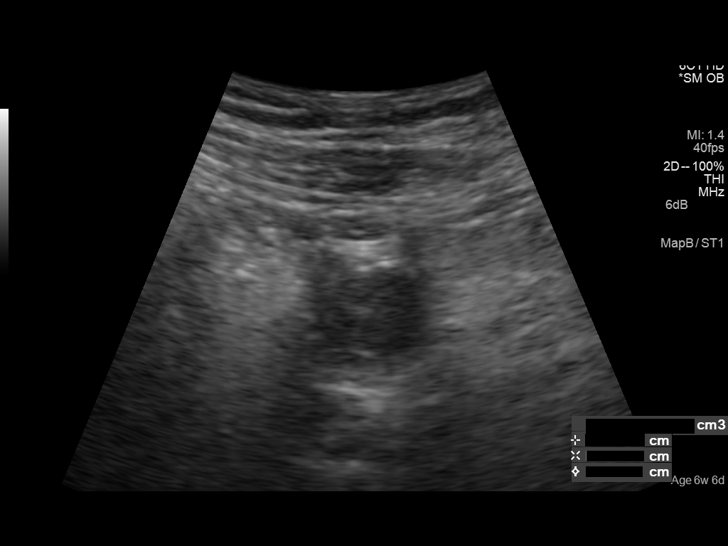
[im 49/102]
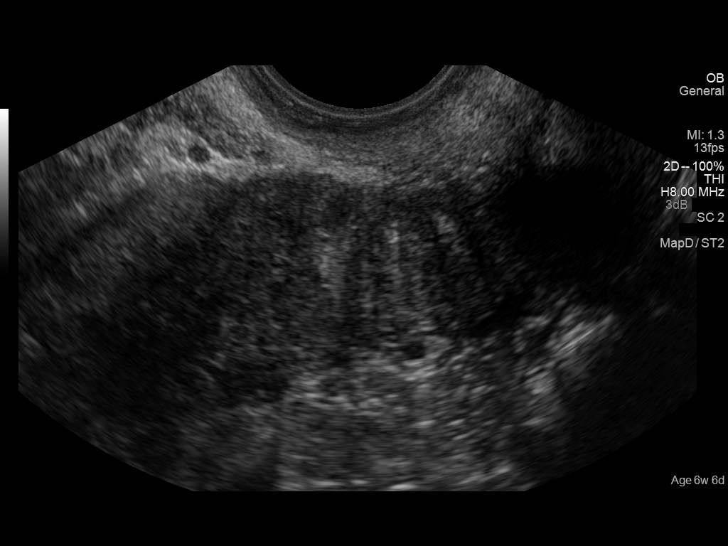
[im 57/102]
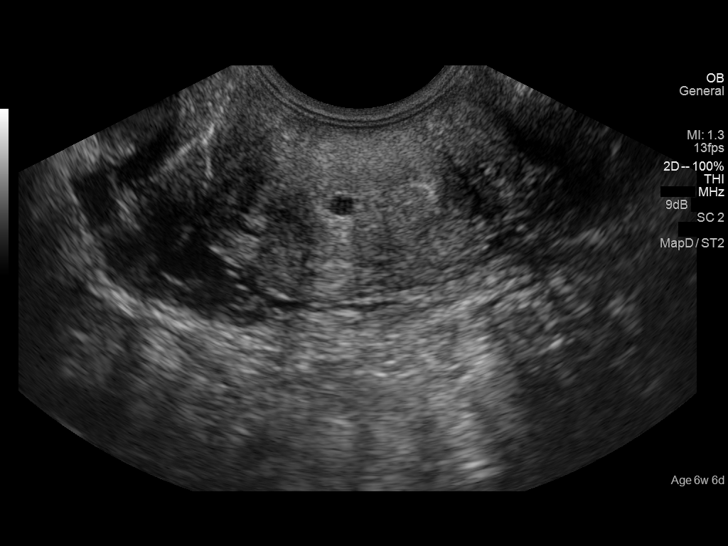
[im 64/102]
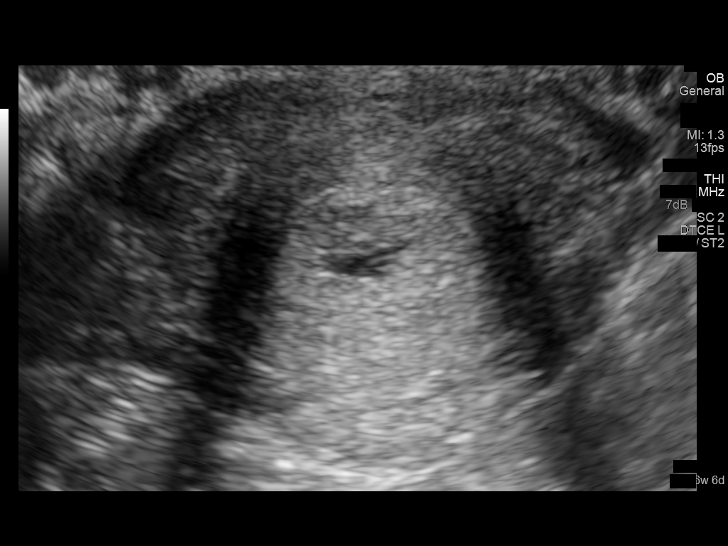
[im 72/102]
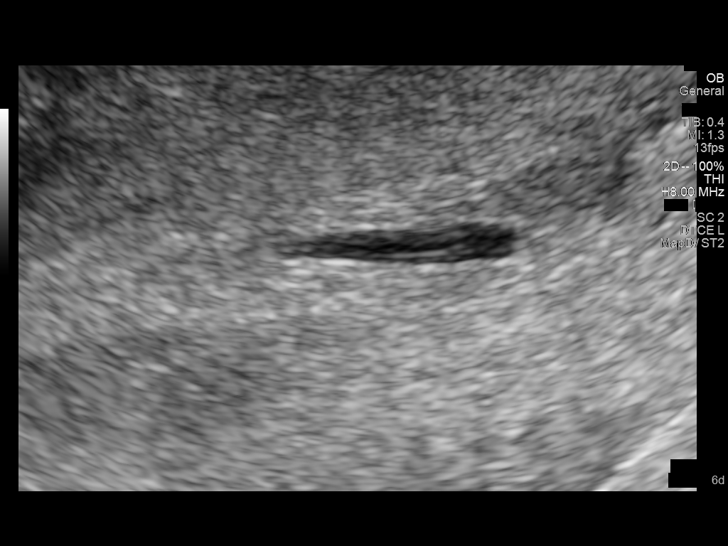
[im 79/102]
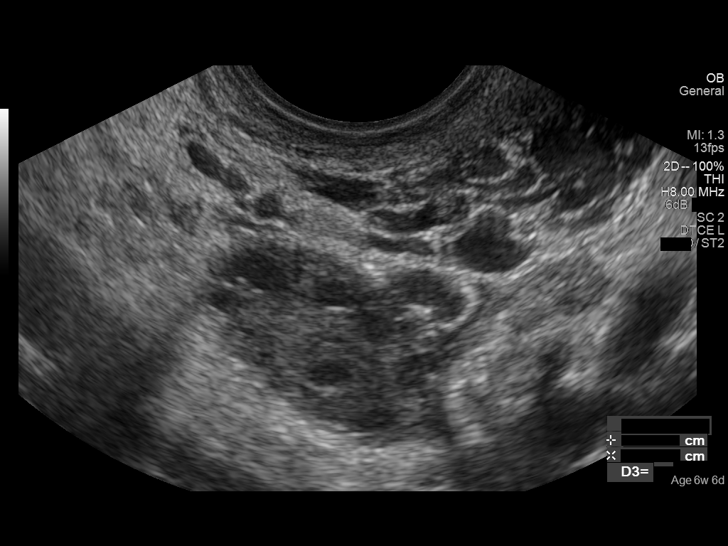
[im 87/102]
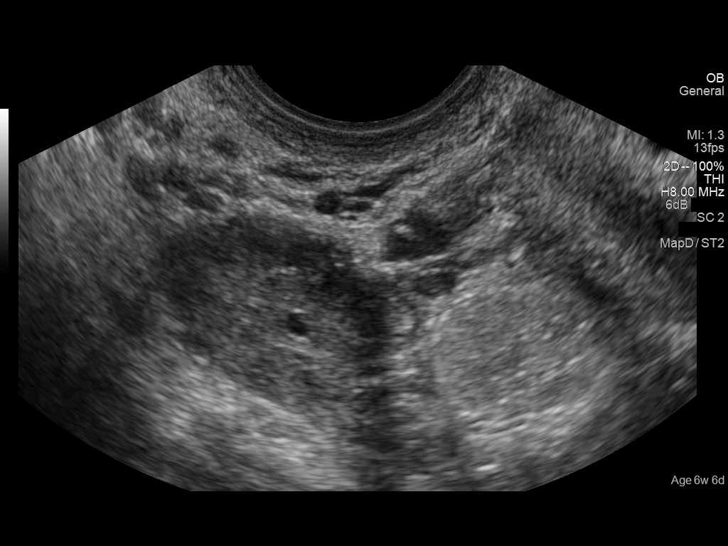
[im 94/102]
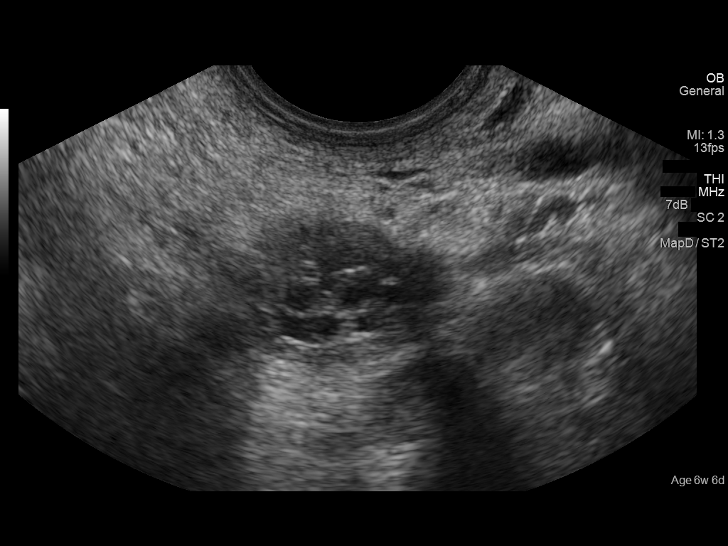
[im 102/102]
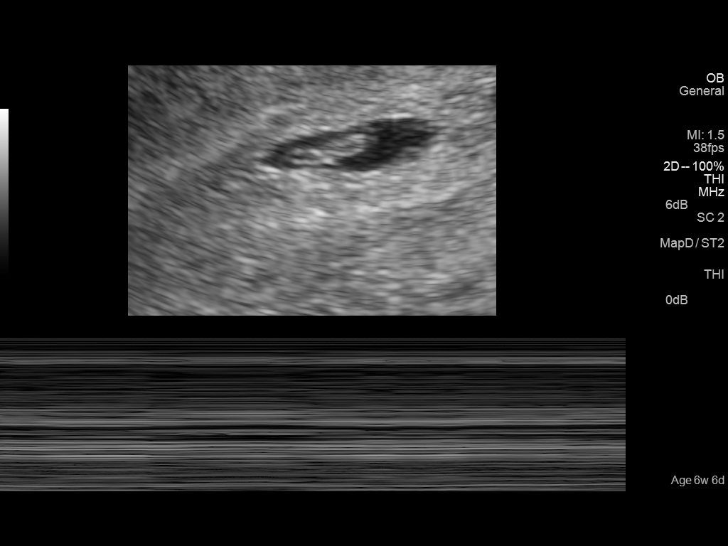

[14 of 28 positions shown; findings below may reference images not displayed]

FINDINGS: Intrauterine gestational sac: Gestational sac is identified within
the endometrial canal which has an atypical ovoid configuration.

Yolk sac:  Not visualized.

Embryo: Linear echogenic structure within the gestational sac may
represent a fetal pole.

Cardiac Activity: Not detected.

CRL:  0.6 cn  mm   6 w   3 d                  US EDC: 06/04/2015.

Maternal uterus/adnexae: Unremarkable.
IMPRESSION: Findings are suspicious but not yet definitive for failed pregnancy.
Recommend follow-up US in 10-14 days for definitive diagnosis. This
recommendation follows SRU consensus guidelines: Diagnostic Criteria
for Nonviable Pregnancy Early in the First Trimester. N Engl J Med

## 2016-01-22 IMAGING — US US OB TRANSVAGINAL
1 series · 15 of 28 positions shown · non-contrast
Comparison: Pelvic ultrasound dated earlier on 04/04/2015

CLINICAL DATA: 24-year-old pregnant female with spotting and pain.

EXAM:
OBSTETRIC <14 WK US AND TRANSVAGINAL OB US
TECHNIQUE: Transvaginal ultrasound examination was performed for complete
evaluation of the gestation as well as the maternal uterus, adnexal
regions, and pelvic cul-de-sac.

[Series 1: us ob transvaginal · 15 of 43 slices shown]
[im 1/43]
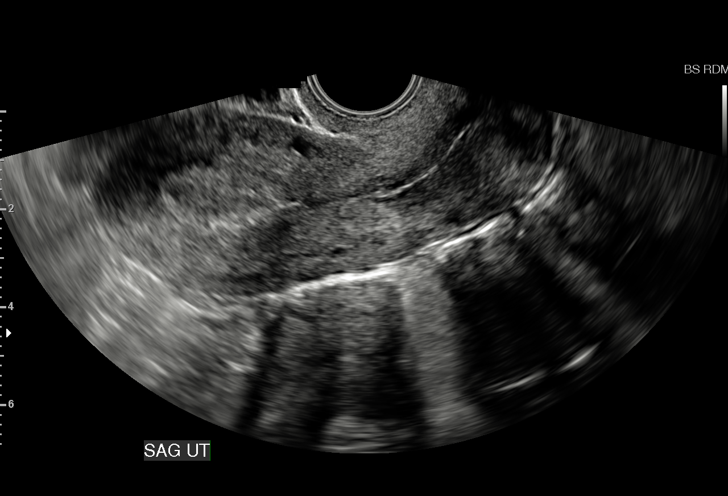
[im 4/43]
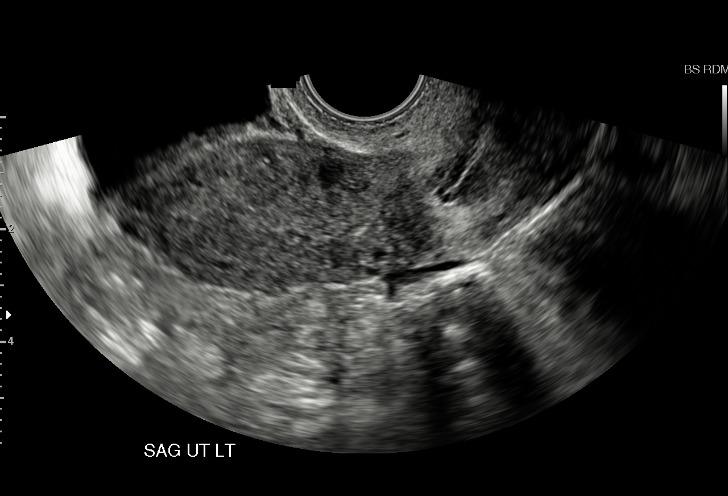
[im 7/43]
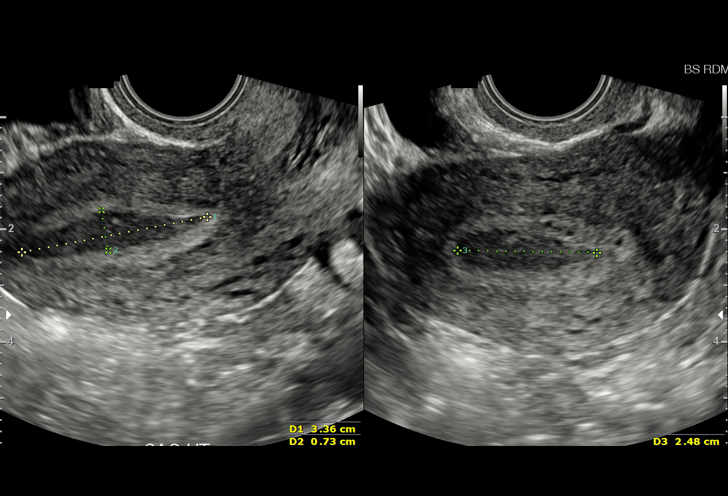
[im 10/43]
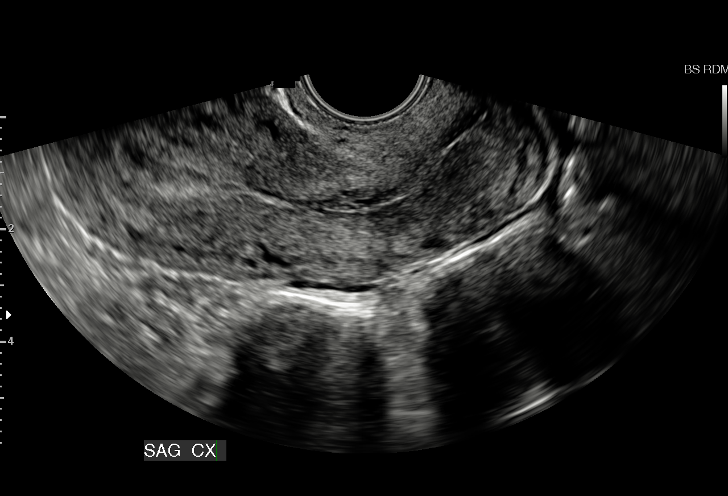
[im 13/43]
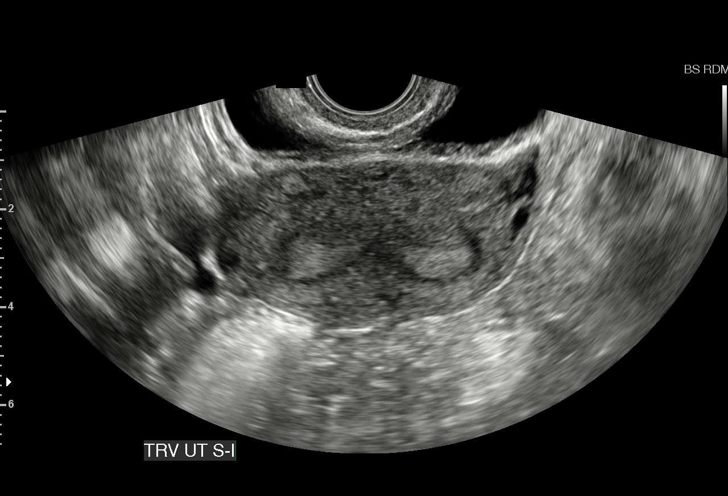
[im 16/43]
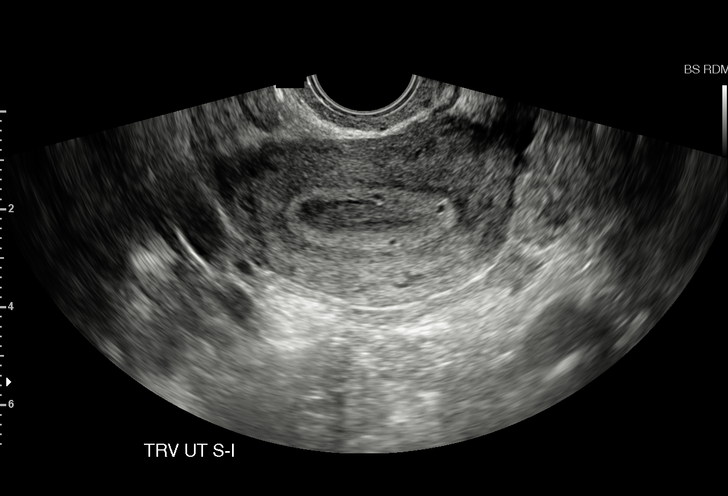
[im 19/43]
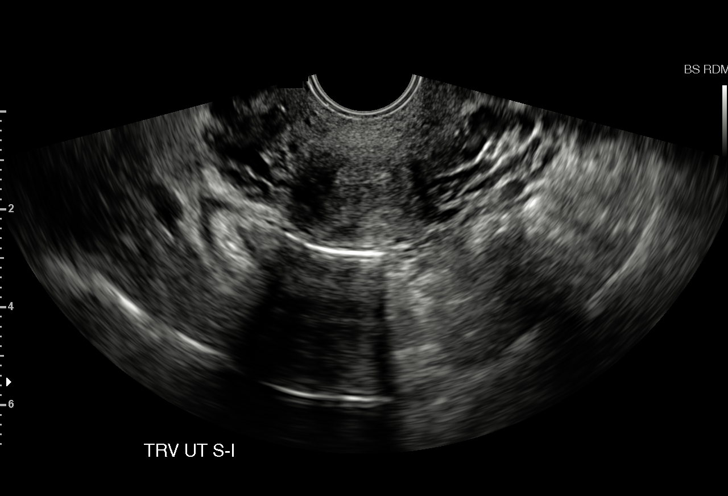
[im 22/43]
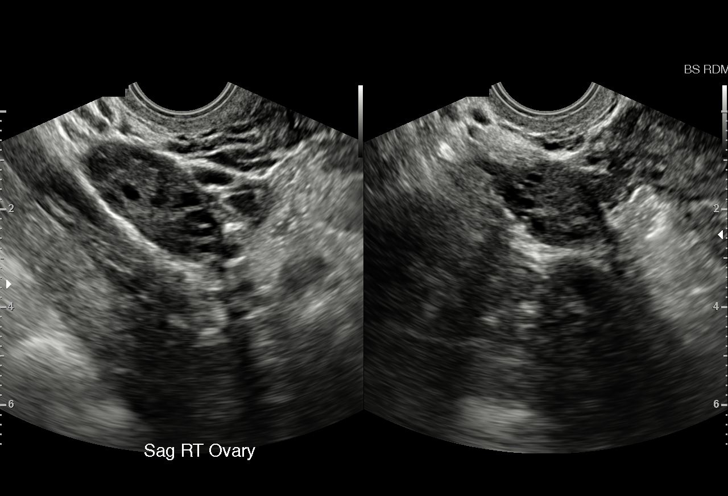
[im 24/43]
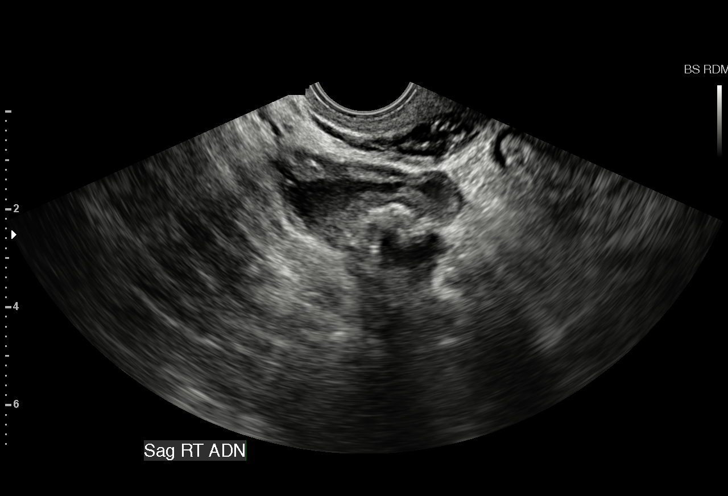
[im 27/43]
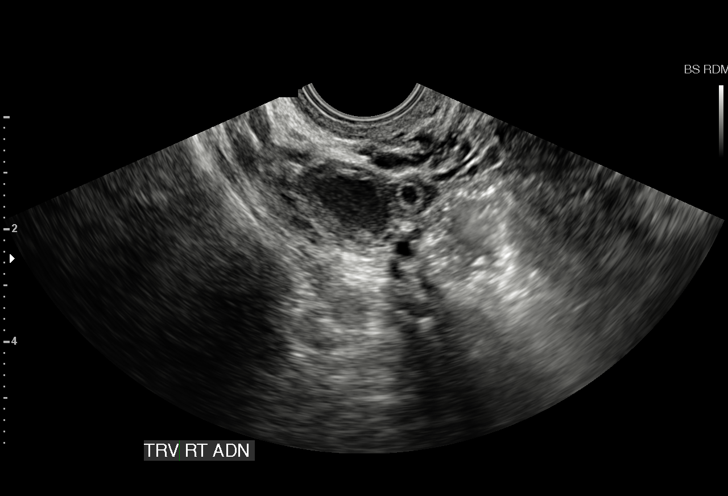
[im 30/43]
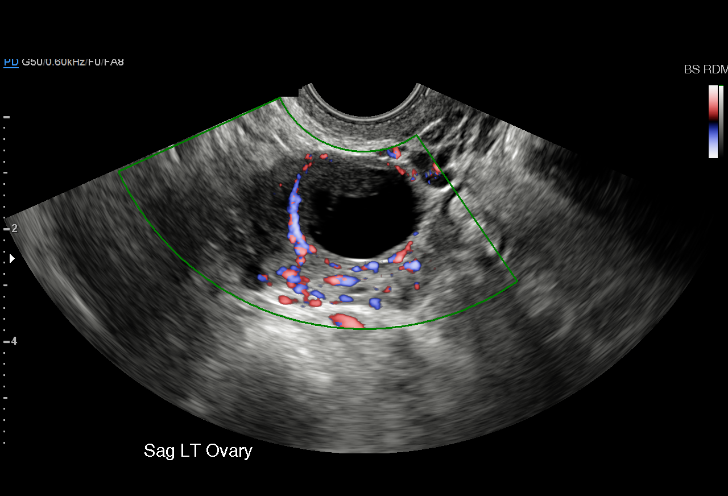
[im 33/43]
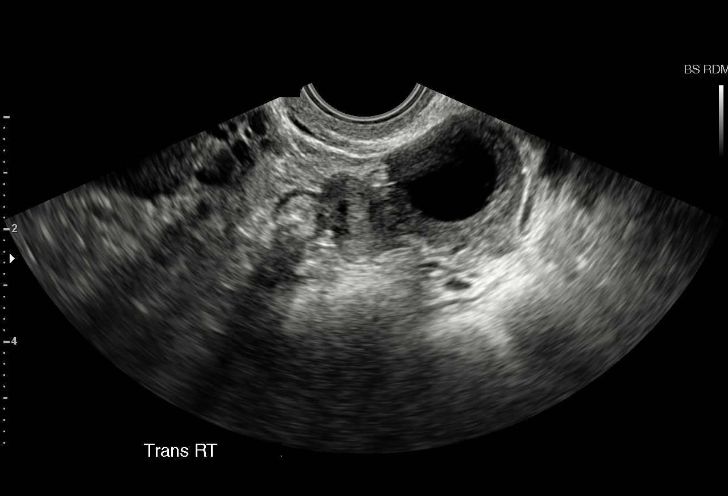
[im 36/43]
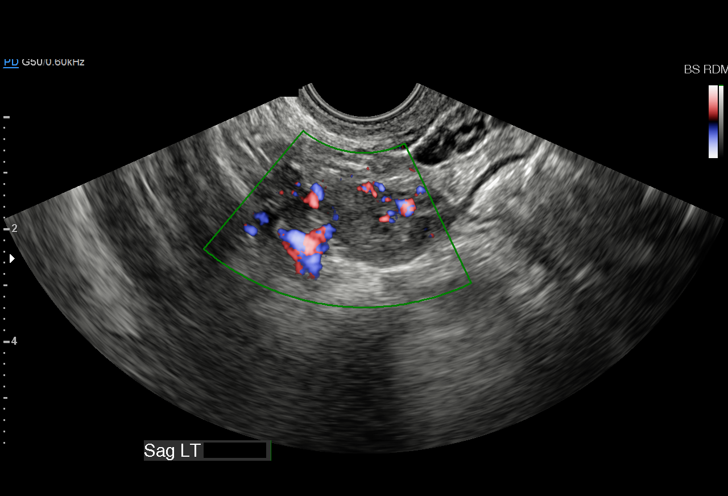
[im 39/43]
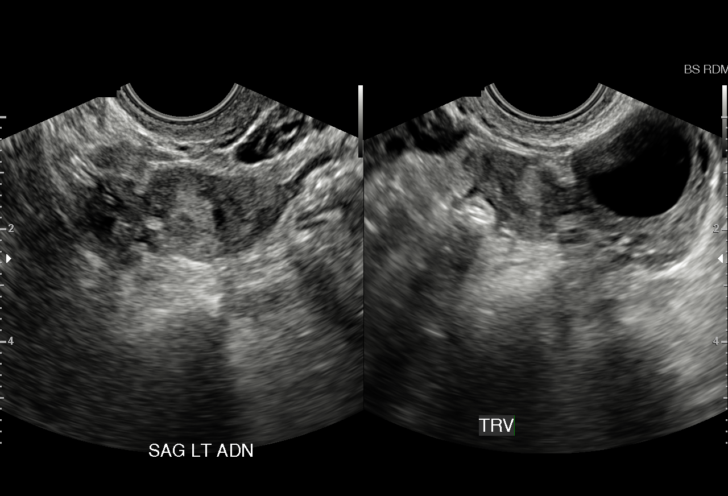
[im 43/43]
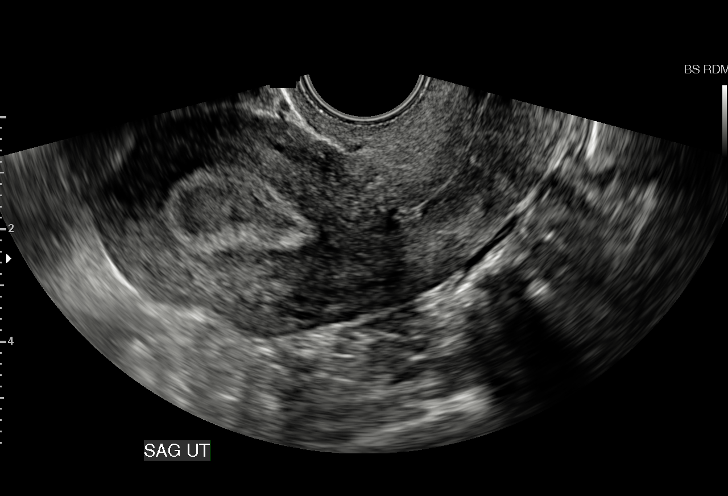

[15 of 28 positions shown; findings below may reference images not displayed]

FINDINGS: The uterus is anteverted. No intrauterine pregnancy identified.
Complex appearing echogenic material within the endometrial canal
again noted which may represent hemorrhagic fluid or proteinaceous
debris.

The right ovary measures 3.5 x 1.5 x 2.1 cm and appears
unremarkable. A tubular appearing structure with intraluminal
low-level echogenic debris again noted, which may represent
pyosalpinx.

The left ovary measures 3.4 x 2.6 x 2.6 cm. A corpus luteum is noted
within the left ovary. There is a 2.5 x 1.6 x 1.6 cm solid-appearing
echogenic structure adjacent to the left ovary. Doppler images
demonstrate presence of internal flow within this structure. This is
structures appear to be separate from the left ovary on the cine
compression images and is concerning for ectopic pregnancy.

No free fluid within the pelvis.
IMPRESSION: No intrauterine pregnancy identified.

Solid-appearing structure adjacent to the left ovary concerning for
an ectopic pregnancy. Clinical correlation and obstetrical consult
is advised.

Critical Value/emergent results were called by telephone at the time
KLPIGBB MOOLMAN , who verbally acknowledged these results.

## 2016-01-22 IMAGING — US US OB TRANSVAGINAL
1 series · 14 of 28 positions shown · non-contrast
Comparison: None.

CLINICAL DATA: Spotting. Positive urine pregnancy test. Estimated
gestational age by LMP is 6 weeks 6 days. Quantitative beta HCG is
642.

EXAM:
OBSTETRIC <14 WK US AND TRANSVAGINAL OB US
TECHNIQUE: Both transabdominal and transvaginal ultrasound examinations were
performed for complete evaluation of the gestation as well as the
maternal uterus, adnexal regions, and pelvic cul-de-sac.
Transvaginal technique was performed to assess early pregnancy.

[Series 1: us ob transvaginal · 43 acquisitions, 14 frames shown]
[im 2/43]
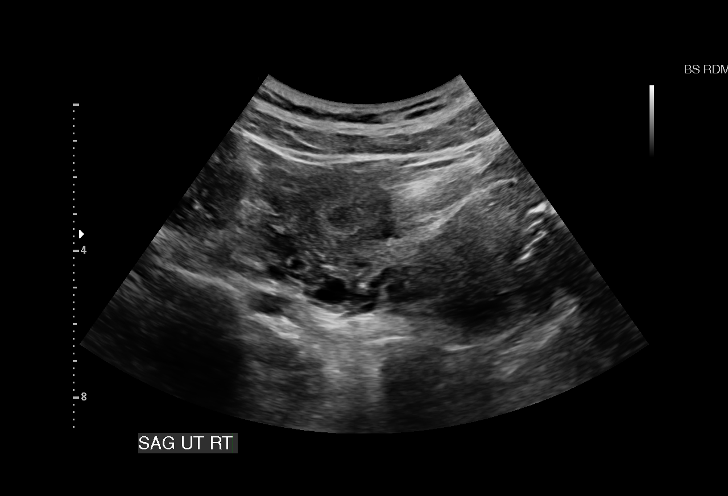
[im 5/43]
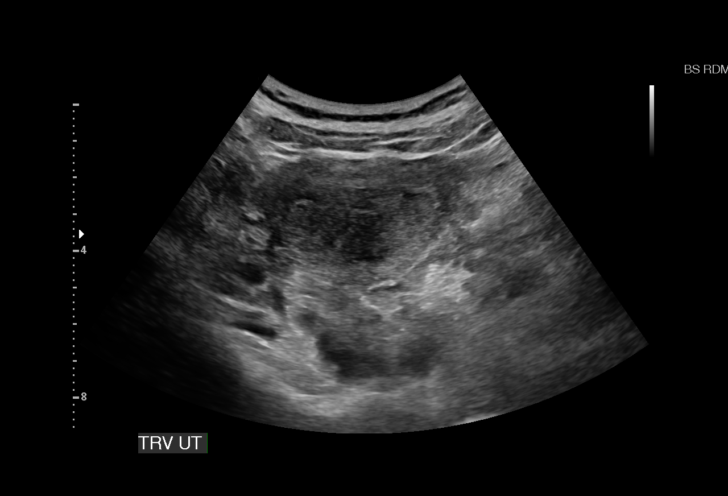
[im 8/43]
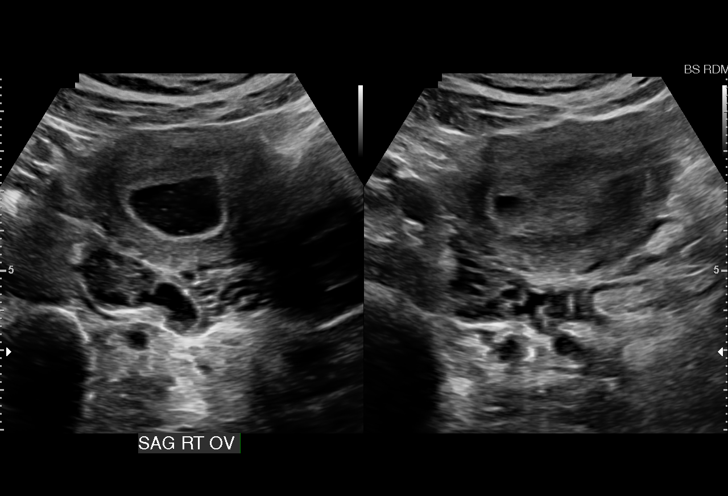
[im 11/43]
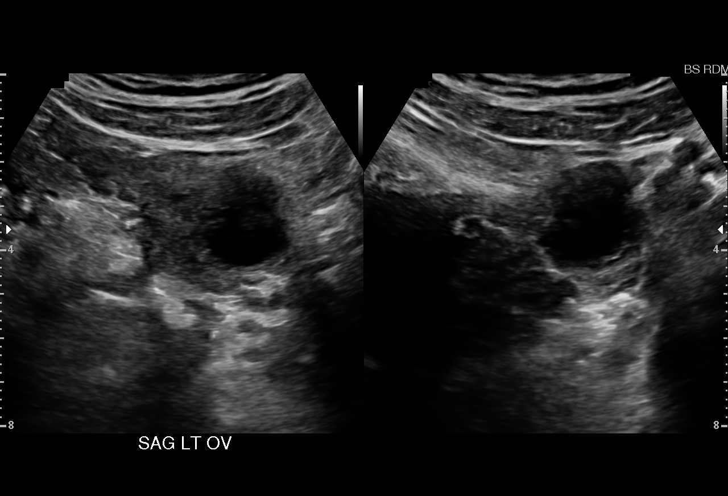
[im 15/43]
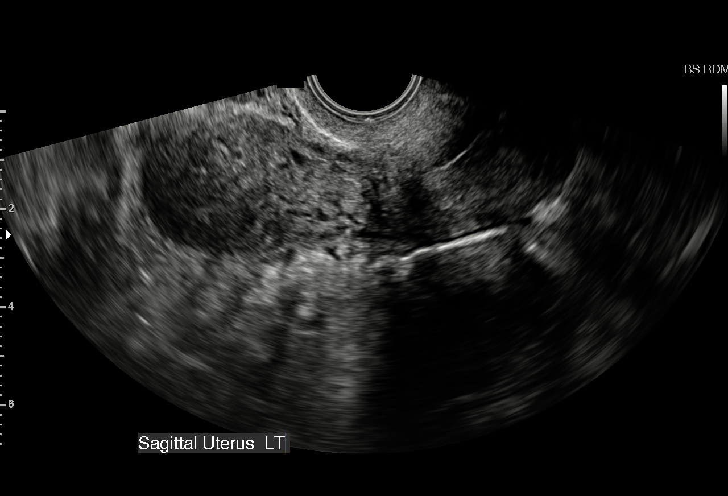
[im 18/43]
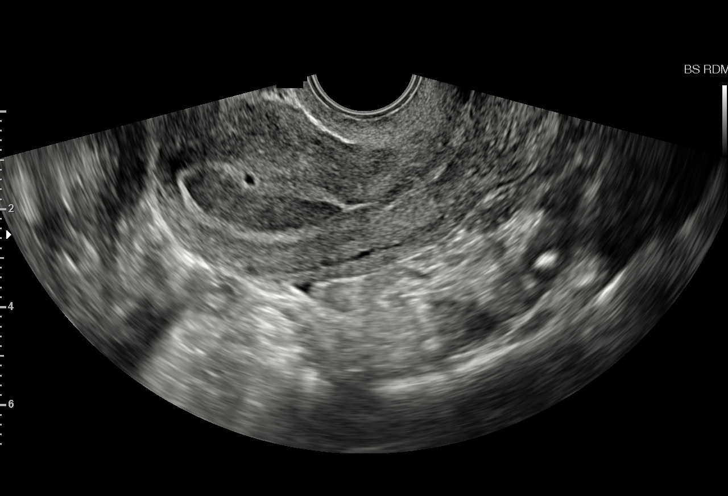
[im 21/43]
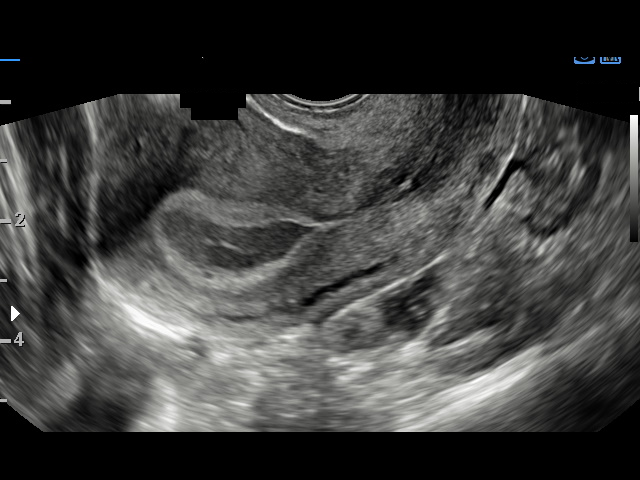
[im 24/43]
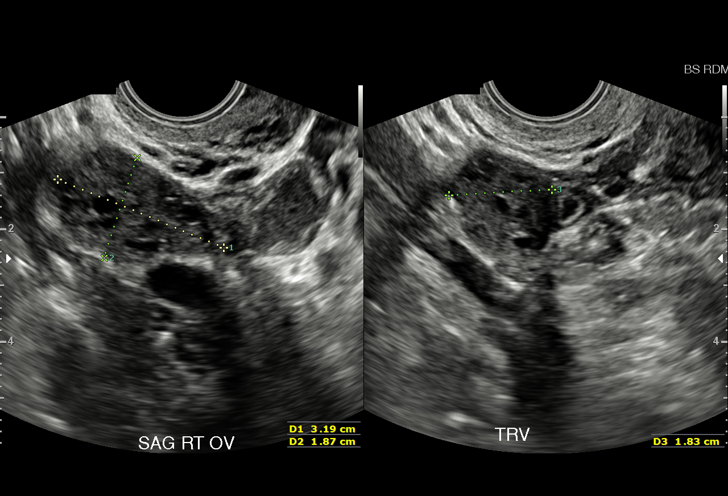
[im 27/43]
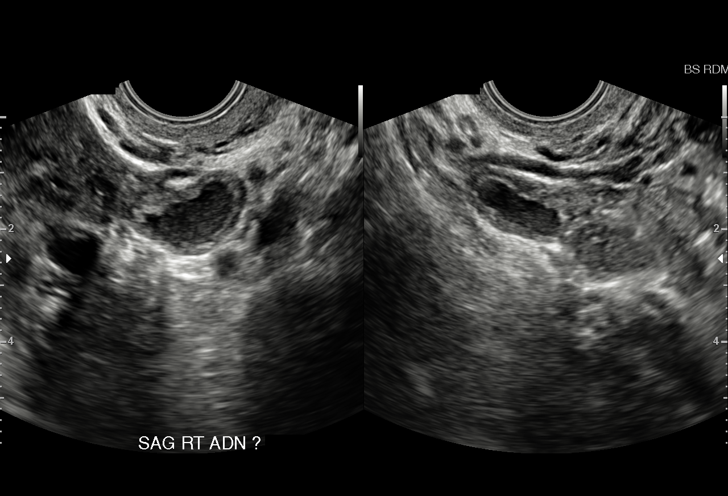
[im 30/43]
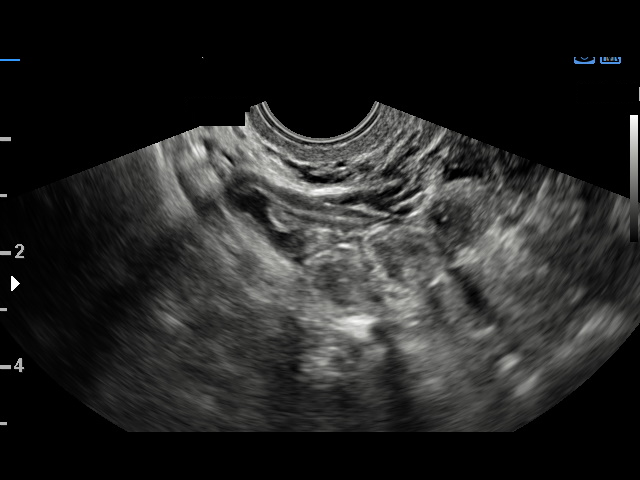
[im 33/43]
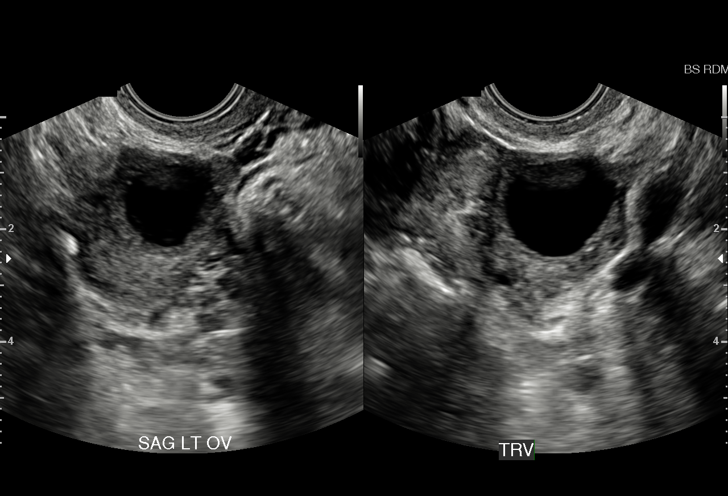
[im 36/43]
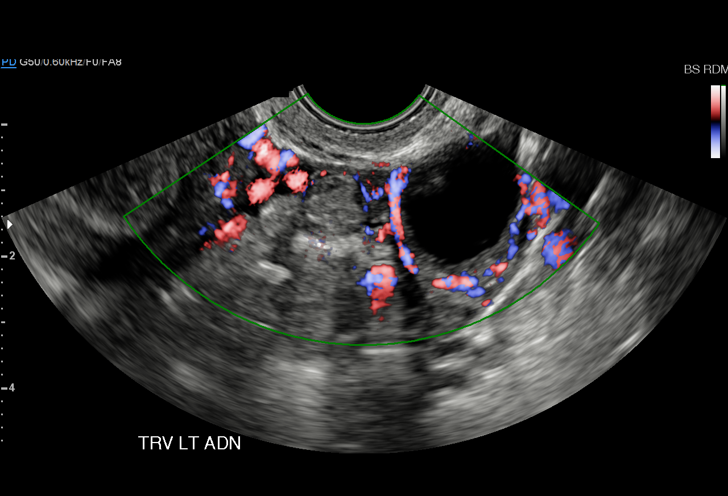
[im 39/43]
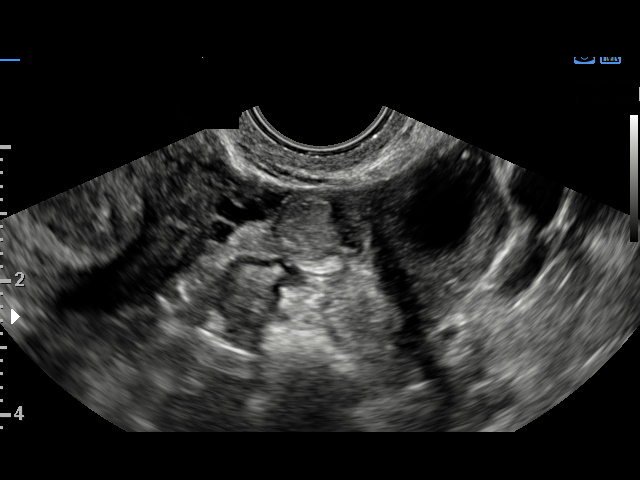
[im 43/43]
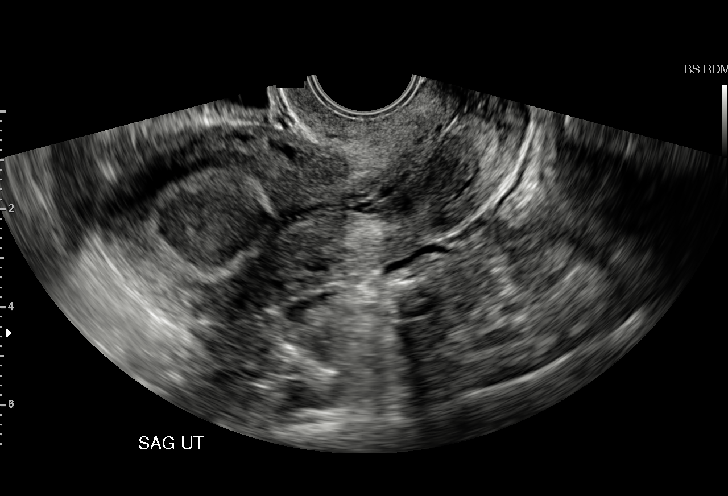

[14 of 28 positions shown; findings below may reference images not displayed]

FINDINGS: Intrauterine gestational sac: No definitive intrauterine gestational
sac identified. There is a tiny cystic collection located
eccentrically within a distended fluid-filled endometrium.

Yolk sac:  Not identified.

Embryo:  Not identified.

Cardiac Activity: Not identified.

Maternal uterus/adnexae:

Uterus: The uterus is anteverted. The endometrium is expanded with
complex fluid or hemorrhage measuring about 3.2 cm in thickness.
There is a tiny eccentric cystic collection which could possibly
represent an early intrauterine gestational sac but is not
definitive for gestational sac. No fluid in the cervix. No
myometrial mass lesions.

Right ovary: Right ovary measures 3.2 x 1.9 x 1.8 cm Normal size and
normal follicular changes. There is a dilated tubular structure in
the right adnexa filled with complex fluid with internal echoes.
This suggest study dilated fallopian tube possibly a pyosalpinx or
hydrosalpinx.

Left ovary: Left ovary measures 2.9 x 2.6 x 2.4 cm. There is an
ovarian cyst measuring about 2 cm diameter with prominent
surrounding flow on color flow Doppler imaging consistent with a
corpus luteal cyst. In the left adnexa, there is a circumscribed
ovoid hyperechoic solid-appearing structure measuring 1.1 x 1.8 x
1.1 cm. Small amount of flow is demonstrated around the structure.
This could represent an early ectopic pregnancy.

No free fluid in the pelvis.
IMPRESSION: No definitive intrauterine gestational sac is identified.
Heterogeneous probably hemorrhagic fluid collection within the
endometrium. Tiny eccentric cystic collection could conceivably
represent an early gestational sac.

Possible pyosalpinx on the right.

Corpus luteal cyst on the left ovary with left adnexal solid
appearing structure which could represent early ectopic pregnancy.

Suggest follow-up ultrasound in 10-14 days and follow-up with serial
beta HCG for further evaluation.

These results were called by telephone at the time of interpretation
on 04/04/2015 at [DATE] to Dr. HHJGG SETURI , who verbally
acknowledged these results.

## 2016-02-07 ENCOUNTER — Encounter (HOSPITAL_COMMUNITY): Payer: Self-pay

## 2017-10-01 ENCOUNTER — Encounter: Payer: Medicaid Other | Admitting: Certified Nurse Midwife

## 2017-10-08 ENCOUNTER — Other Ambulatory Visit: Payer: Self-pay | Admitting: Certified Nurse Midwife

## 2017-10-08 ENCOUNTER — Ambulatory Visit: Payer: Medicaid Other | Admitting: Certified Nurse Midwife

## 2017-10-08 ENCOUNTER — Other Ambulatory Visit (INDEPENDENT_AMBULATORY_CARE_PROVIDER_SITE_OTHER): Payer: Medicaid Other

## 2017-10-08 VITALS — BP 103/70 | HR 83 | Ht 62.0 in | Wt 111.1 lb

## 2017-10-08 DIAGNOSIS — N644 Mastodynia: Secondary | ICD-10-CM

## 2017-10-08 DIAGNOSIS — Z3A08 8 weeks gestation of pregnancy: Secondary | ICD-10-CM | POA: Diagnosis not present

## 2017-10-08 DIAGNOSIS — N8311 Corpus luteum cyst of right ovary: Secondary | ICD-10-CM

## 2017-10-08 DIAGNOSIS — Z3201 Encounter for pregnancy test, result positive: Secondary | ICD-10-CM | POA: Diagnosis not present

## 2017-10-08 DIAGNOSIS — O09291 Supervision of pregnancy with other poor reproductive or obstetric history, first trimester: Secondary | ICD-10-CM

## 2017-10-08 DIAGNOSIS — O3411 Maternal care for benign tumor of corpus uteri, first trimester: Secondary | ICD-10-CM | POA: Diagnosis not present

## 2017-10-08 DIAGNOSIS — O0911 Supervision of pregnancy with history of ectopic or molar pregnancy, first trimester: Secondary | ICD-10-CM

## 2017-10-08 DIAGNOSIS — N926 Irregular menstruation, unspecified: Secondary | ICD-10-CM | POA: Diagnosis not present

## 2017-10-08 DIAGNOSIS — R1110 Vomiting, unspecified: Secondary | ICD-10-CM

## 2017-10-08 DIAGNOSIS — O3680X0 Pregnancy with inconclusive fetal viability, not applicable or unspecified: Secondary | ICD-10-CM | POA: Diagnosis not present

## 2017-10-08 DIAGNOSIS — G4709 Other insomnia: Secondary | ICD-10-CM

## 2017-10-08 LAB — POCT URINE PREGNANCY: Preg Test, Ur: POSITIVE — AB

## 2017-10-08 NOTE — Progress Notes (Signed)
Pt states she is here for a confirmation of pregnancy. She wants to be sure its not an ectopic.

## 2017-10-08 NOTE — Patient Instructions (Signed)
First Trimester of Pregnancy The first trimester of pregnancy is from week 1 until the end of week 13 (months 1 through 3). During this time, your baby will begin to develop inside you. At 6-8 weeks, the eyes and face are formed, and the heartbeat can be seen on ultrasound. At the end of 12 weeks, all the baby's organs are formed. Prenatal care is all the medical care you receive before the birth of your baby. Make sure you get good prenatal care and follow all of your doctor's instructions. Follow these instructions at home: Medicines  Take over-the-counter and prescription medicines only as told by your doctor. Some medicines are safe and some medicines are not safe during pregnancy.  Take a prenatal vitamin that contains at least 600 micrograms (mcg) of folic acid.  If you have trouble pooping (constipation), take medicine that will make your stool soft (stool softener) if your doctor approves. Eating and drinking  Eat regular, healthy meals.  Your doctor will tell you the amount of weight gain that is right for you.  Avoid raw meat and uncooked cheese.  If you feel sick to your stomach (nauseous) or throw up (vomit): ? Eat 4 or 5 small meals a day instead of 3 large meals. ? Try eating a few soda crackers. ? Drink liquids between meals instead of during meals.  To prevent constipation: ? Eat foods that are high in fiber, like fresh fruits and vegetables, whole grains, and beans. ? Drink enough fluids to keep your pee (urine) clear or pale yellow. Activity  Exercise only as told by your doctor. Stop exercising if you have cramps or pain in your lower belly (abdomen) or low back.  Do not exercise if it is too hot, too humid, or if you are in a place of great height (high altitude).  Try to avoid standing for long periods of time. Move your legs often if you must stand in one place for a long time.  Avoid heavy lifting.  Wear low-heeled shoes. Sit and stand up straight.  You  can have sex unless your doctor tells you not to. Relieving pain and discomfort  Wear a good support bra if your breasts are sore.  Take warm water baths (sitz baths) to soothe pain or discomfort caused by hemorrhoids. Use hemorrhoid cream if your doctor says it is okay.  Rest with your legs raised if you have leg cramps or low back pain.  If you have puffy, bulging veins (varicose veins) in your legs: ? Wear support hose or compression stockings as told by your doctor. ? Raise (elevate) your feet for 15 minutes, 3-4 times a day. ? Limit salt in your food. Prenatal care  Schedule your prenatal visits by the twelfth week of pregnancy.  Write down your questions. Take them to your prenatal visits.  Keep all your prenatal visits as told by your doctor. This is important. Safety  Wear your seat belt at all times when driving.  Make a list of emergency phone numbers. The list should include numbers for family, friends, the hospital, and police and fire departments. General instructions  Ask your doctor for a referral to a local prenatal class. Begin classes no later than at the start of month 6 of your pregnancy.  Ask for help if you need counseling or if you need help with nutrition. Your doctor can give you advice or tell you where to go for help.  Do not use hot tubs, steam rooms, or  saunas.  Do not douche or use tampons or scented sanitary pads.  Do not cross your legs for long periods of time.  Avoid all herbs and alcohol. Avoid drugs that are not approved by your doctor.  Do not use any tobacco products, including cigarettes, chewing tobacco, and electronic cigarettes. If you need help quitting, ask your doctor. You may get counseling or other support to help you quit.  Avoid cat litter boxes and soil used by cats. These carry germs that can cause birth defects in the baby and can cause a loss of your baby (miscarriage) or stillbirth.  Visit your dentist. At home, brush  your teeth with a soft toothbrush. Be gentle when you floss. Contact a doctor if:  You are dizzy.  You have mild cramps or pressure in your lower belly.  You have a nagging pain in your belly area.  You continue to feel sick to your stomach, you throw up, or you have watery poop (diarrhea).  You have a bad smelling fluid coming from your vagina.  You have pain when you pee (urinate).  You have increased puffiness (swelling) in your face, hands, legs, or ankles. Get help right away if:  You have a fever.  You are leaking fluid from your vagina.  You have spotting or bleeding from your vagina.  You have very bad belly cramping or pain.  You gain or lose weight rapidly.  You throw up blood. It may look like coffee grounds.  You are around people who have [place] measles, fifth disease, or chickenpox.  You have a very bad headache.  You have shortness of breath.  You have any kind of trauma, such as from a fall or a car accident. Summary  The first trimester of pregnancy is from week 1 until the end of week 13 (months 1 through 3).  To take care of yourself and your unborn baby, you will need to eat healthy meals, take medicines only if your doctor tells you to do so, and do activities that are safe for you and your baby.  Keep all follow-up visits as told by your doctor. This is important as your doctor will have to ensure that your baby is healthy and growing well. This information is not intended to replace advice given to you by your health care provider. Make sure you discuss any questions you have with your health care provider. Document Released: 09/20/2007 Document Revised: 04/11/2016 Document Reviewed: 04/11/2016 Elsevier Interactive Patient Education  2017 [employer]. Common Medications Safe in Pregnancy  Acne:      Constipation:  Benzoyl Peroxide     Colace  Clindamycin      Dulcolax Suppository  Topica Erythromycin     Fibercon  Salicylic  Acid      Metamucil         Miralax AVOID:        Senakot   Accutane    Cough:  Retin-A       Cough Drops  Tetracycline      Phenergan w/ Codeine if Rx  Minocycline      Robitussin (Plain & DM)  Antibiotics:     Crabs/Lice:  Ceclor       RID  Cephalosporins    AVOID:  E-Mycins      Kwell  Keflex  Macrobid/Macrodantin   Diarrhea:  Penicillin      Kao-Pectate  Zithromax      Imodium AD         PUSH  FLUIDS AVOID:       Cipro     Fever:  Tetracycline      Tylenol (Regular or Extra  Minocycline       Strength)  Levaquin      Extra Strength-Do not          Exceed 8 tabs/24 hrs Caffeine:        <200mg/day (equiv. To 1 cup of coffee or  approx. 3 12 oz sodas)         Gas: Cold/Hayfever:       Gas-X  Benadryl      Mylicon  Claritin       Phazyme  **Claritin-D        Chlor-Trimeton    Headaches:  Dimetapp      ASA-Free Excedrin  Drixoral-Non-Drowsy     Cold Compress  Mucinex (Guaifenasin)     Tylenol (Regular or Extra  Sudafed/Sudafed-12 Hour     Strength)  **Sudafed PE Pseudoephedrine   Tylenol Cold & Sinus     Vicks Vapor Rub  Zyrtec  **AVOID if Problems With Blood Pressure         Heartburn: Avoid lying down for at least 1 hour after meals  Aciphex      Maalox     Rash:  Milk of Magnesia     Benadryl    Mylanta       1% Hydrocortisone Cream  Pepcid  Pepcid Complete   Sleep Aids:  Prevacid      Ambien   Prilosec       Benadryl  Rolaids       Chamomile Tea  Tums (Limit 4/day)     Unisom  Zantac       Tylenol PM         Warm milk-add vanilla or  Hemorrhoids:       Sugar for taste  Anusol/Anusol H.C.  (RX: Analapram 2.5%)  Sugar Substitutes:  Hydrocortisone OTC     Ok in moderation  Preparation H      Tucks        Vaseline lotion applied to tissue with wiping    Herpes:     Throat:  Acyclovir      Oragel  Famvir  Valtrex     Vaccines:         Flu Shot Leg Cramps:       *Gardasil  Benadryl      Hepatitis A         Hepatitis B Nasal  Spray:       Pneumovax  Saline Nasal Spray     Polio Booster         Tetanus Nausea:       Tuberculosis test or PPD  Vitamin B6 25 mg TID   AVOID:    Dramamine      *Gardasil  Emetrol       Live Poliovirus  Ginger Root 250 mg QID    MMR (measles, mumps &  High Complex Carbs @ Bedtime    rebella)  Sea Bands-Accupressure    Varicella (Chickenpox)  Unisom 1/2 tab TID     *No known complications           If received before Pain:         Known pregnancy;   Darvocet       Resume series after  Lortab        Delivery  Percocet    Yeast:   Tramadol        Femstat  Tylenol 3      Gyne-lotrimin  Ultram       Monistat  Vicodin           MISC:         All Sunscreens           Hair Coloring/highlights          Insect Repellant's          (Including DEET)         Mystic Tans Morning Sickness Morning sickness is when you feel sick to your stomach (nauseous) during pregnancy. You may feel sick to your stomach and throw up (vomit). You may feel sick in the morning, but you can feel this way any time of day. Some women feel very sick to their stomach and cannot stop throwing up (hyperemesis gravidarum). Follow these instructions at home:  Only take medicines as told by your doctor.  Take multivitamins as told by your doctor. Taking multivitamins before getting pregnant can stop or lessen the harshness of morning sickness.  Eat dry toast or unsalted crackers before getting out of bed.  Eat 5 to 6 small meals a day.  Eat dry and bland foods like rice and baked potatoes.  Do not drink liquids with meals. Drink between meals.  Do not eat greasy, fatty, or spicy foods.  Have someone cook for you if the smell of food causes you to feel sick or throw up.  If you feel sick to your stomach after taking prenatal vitamins, take them at night or with a snack.  Eat protein when you need a snack (nuts, yogurt, cheese).  Eat unsweetened gelatins for dessert.  Wear a bracelet used for sea sickness  (acupressure wristband).  Go to a doctor that puts thin needles into certain body points (acupuncture) to improve how you feel.  Do not smoke.  Use a humidifier to keep the air in your house free of odors.  Get lots of fresh air. Contact a doctor if:  You need medicine to feel better.  You feel dizzy or lightheaded.  You are losing weight. Get help right away if:  You feel very sick to your stomach and cannot stop throwing up.  You pass out (faint). This information is not intended to replace advice given to you by your health care provider. Make sure you discuss any questions you have with your health care provider. Document Released: 05/11/2004 Document Revised: 09/09/2015 Document Reviewed: 09/18/2012 Elsevier Interactive Patient Education  2017 [employer].

## 2017-10-11 DIAGNOSIS — O0911 Supervision of pregnancy with history of ectopic or molar pregnancy, first trimester: Secondary | ICD-10-CM | POA: Insufficient documentation

## 2017-10-11 DIAGNOSIS — O09291 Supervision of pregnancy with other poor reproductive or obstetric history, first trimester: Secondary | ICD-10-CM | POA: Insufficient documentation

## 2017-10-11 NOTE — Progress Notes (Signed)
GYN ENCOUNTER NOTE  Subjective:       Mercedes Potter is a 27 y.o. [id number] female here for pregnancy confirmation. History is significant for miscarriage and ectopic pregnancy.   Endorses nausea with vomiting, breast tenderness, and insomnia. Denies difficulty breathing or respiratory distress, chest pain, abdominal pain or cramping, vaginal bleeding, dysuria, and leg pain or swelling.    Gynecologic History  Patient's last menstrual period was 07/31/2017 (approximate).  Contraception: none  Last Pap: unknown.   Obstetric History  OB History  Gravida Para Term Preterm AB Living  3       1 1   SAB TAB Ectopic Multiple Live Births  1       1    # Outcome Date GA Lbr Len/2nd Weight Sex Delivery Anes PTL Lv  3 Gravida           2 SAB           1 Gravida     M Vag-Spont   LIV    Past Medical History:  Diagnosis Date  . Anemia     Past Surgical History:  Procedure Laterality Date  . INNER EAR SURGERY Right     Current Outpatient Medications on File Prior to Visit  Medication Sig Dispense Refill  . Prenatal Vit-Fe Fumarate-FA (PRENATAL MULTIVITAMIN) TABS tablet Take 1 tablet by mouth daily at 12 noon.     No current facility-administered medications on file prior to visit.     Allergies  Allergen Reactions  . Latex Itching  . Nickel Rash    Social History   Socioeconomic History  . Marital status: Single    Spouse name: Not on file  . Number of children: Not on file  . Years of education: Not on file  . Highest education level: Not on file  Occupational History  . Not on file  Social Needs  . Financial resource strain: Not on file  . Food insecurity:    Worry: Not on file    Inability: Not on file  . Transportation needs:    Medical: Not on file    Non-medical: Not on file  Tobacco Use  . Smoking status: Current Every Day Smoker    Packs/day: 0.50    Types: Cigarettes  Substance and Sexual Activity  . Alcohol use: Yes    Comment: Wine on  04/04/2015 had home UPT  . Drug use: Yes    Types: Marijuana    Comment: Last use was 02/2015  . Sexual activity: Not on file  Lifestyle  . Physical activity:    Days per week: Not on file    Minutes per session: Not on file  . Stress: Not on file  Relationships  . Social connections:    Talks on phone: Not on file    Gets together: Not on file    Attends religious service: Not on file    Active member of club or organization: Not on file    Attends meetings of clubs or organizations: Not on file    Relationship status: Not on file  . Intimate partner violence:    Fear of current or ex partner: Not on file    Emotionally abused: Not on file    Physically abused: Not on file    Forced sexual activity: Not on file  Other Topics Concern  . Not on file  Social History Narrative  . Not on file    Family History  Problem Relation Age of Onset  .  Hypertension Father     The following portions of the patient's history were reviewed and updated as appropriate: allergies, current medications, past family history, past medical history, past social history, past surgical history and problem list.  Review of Systems  ROS negative except as noted above. Information obtained from patient.   Objective:   BP 103/70   Pulse 83   Ht 5\' 2"  (1.575 m)   Wt 111 lb 1 oz (50.4 kg)   LMP 07/31/2017 (Approximate)   BMI 20.31 kg/m   GENERAL: Alert and oriented x 4, no apparent distress.   UPT positive  ULTRASOUND REPORT  Location: [facility] Date of Service:  10/08/2017  Indications: Dating/Viability Findings:  Mason JimSingleton intrauterine pregnancy is visualized with a CRL consistent with 8 4/[redacted] weeks gestation, giving an (U/S) EDD of 05/16/18. A clinical EDD has not been established, however, today's dates do not agree with patient stated LMP of 08/19/17.  FHR: 168 BPM CRL measurement: 19.5 mm Yolk sac and early anatomy is normal.  Right Ovary measures 3.7 x 2.9 x 2.4 cm.  It is normal in appearance. Left Ovary measures 2.4 x 1.9 x 1.7 cm. It is normal appearance. There is evidence of a corpus luteal cyst in the right ovary. Survey of the adnexa demonstrates no adnexal masses. There is no free peritoneal fluid in the cul de sac.  Impression: 1. 8 4/7 week Viable Singleton Intrauterine pregnancy by U/S. 2. A clinical EDD has not been established, so today's ultrasound should be used.  Recommendations: 1.Clinical correlation with the patient's History and Physical Exam. 2. Today's ultrasound should be used to establish EDD of 05/16/18.   Assessment:   1. Missed menses  - POCT urine pregnancy  2. Pregnancy with history of ectopic pregnancy, first trimester   3. Current pregnancy in first trimester with history of spontaneous abortion during prior pregnancy   Plan:   Ultrasound findings reviewed with patient, verbalized understanding. EDD: 05/16/2018.   Samples of [place] given.   Reviewed red flag symptoms and when to call.   RTC x 1-2 week for nurse intake or sooner if needed.    Gunnar BullaJenkins Michelle Joclyn Alsobrook, CNM [facility], [facility]   A total of 20 minutes were spent face-to-face with the patient during this encounter and over half of that time dealt with counseling and coordination of care.

## 2017-10-22 ENCOUNTER — Ambulatory Visit (INDEPENDENT_AMBULATORY_CARE_PROVIDER_SITE_OTHER): Payer: Medicaid Other | Admitting: Certified Nurse Midwife

## 2017-10-22 ENCOUNTER — Other Ambulatory Visit (HOSPITAL_COMMUNITY)
Admission: RE | Admit: 2017-10-22 | Discharge: 2017-10-22 | Disposition: A | Discharge status: Home / Self Care | Payer: Medicaid Other | Source: Ambulatory Visit | Attending: Certified Nurse Midwife | Admitting: Certified Nurse Midwife

## 2017-10-22 VITALS — BP 110/66 | HR 97 | Ht 63.0 in | Wt 111.1 lb

## 2017-10-22 DIAGNOSIS — Z3491 Encounter for supervision of normal pregnancy, unspecified, first trimester: Secondary | ICD-10-CM | POA: Diagnosis present

## 2017-10-22 NOTE — Progress Notes (Signed)
      Mercedes EndoJessica D Potter presents for NOB nurse intake visit. [facility] 10/08/17, with JML.  G4.  [id number].  LMP 07/31/17.  EDD 05/16/18.  Ga. Pregnancy education material explained and given.  0 cats in the home.  NOB labs ordered. BMI less than 30. TSH/HbgA1c not ordered due to BMI less than 30. HIV and drug screen explained and ordered. Genetic screening discussed. Genetic testing; Unsure. Pt to discuss genetic testing with provider. PNV encouraged. Pt to follow up with provider in 2 weeks for NOB physical.

## 2017-10-22 NOTE — Progress Notes (Signed)
I have reviewed the record and concur with patient record and plan of care.    Gunnar BullaJenkins Michelle Jacquelyn Shadrick, CNM [facility], [facility]

## 2017-10-23 LAB — ABO AND RH: Rh Factor: POSITIVE

## 2017-10-23 LAB — CBC WITH DIFFERENTIAL/PLATELET
Basophils Absolute: 0 10*3/uL (ref 0.0–0.2)
Basos: 0 %
EOS (ABSOLUTE): 0.2 10*3/uL (ref 0.0–0.4)
EOS: 2 %
HEMATOCRIT: 34 % (ref 34.0–46.6)
Hemoglobin: 12.1 g/dL (ref 11.1–15.9)
IMMATURE GRANS (ABS): 0 10*3/uL (ref 0.0–0.1)
IMMATURE GRANULOCYTES: 0 %
LYMPHS: 38 %
Lymphocytes Absolute: 3.4 10*3/uL — ABNORMAL HIGH (ref 0.7–3.1)
MCH: 32.9 pg (ref 26.6–33.0)
MCHC: 35.6 g/dL (ref 31.5–35.7)
MCV: 92 fL (ref 79–97)
MONOS ABS: 0.7 10*3/uL (ref 0.1–0.9)
Monocytes: 8 %
NEUTROS PCT: 52 %
Neutrophils Absolute: 4.7 10*3/uL (ref 1.4–7.0)
Platelets: 333 10*3/uL (ref 150–450)
RBC: 3.68 x10E6/uL — ABNORMAL LOW (ref 3.77–5.28)
RDW: 13 % (ref 12.3–15.4)
WBC: 9 10*3/uL (ref 3.4–10.8)

## 2017-10-23 LAB — URINALYSIS, ROUTINE W REFLEX MICROSCOPIC
BILIRUBIN UA: NEGATIVE
Ketones, UA: NEGATIVE
Nitrite, UA: NEGATIVE
SPEC GRAV UA: 1.028 (ref 1.005–1.030)
UUROB: 1 mg/dL (ref 0.2–1.0)
pH, UA: 6.5 (ref 5.0–7.5)

## 2017-10-23 LAB — RPR: RPR: NONREACTIVE

## 2017-10-23 LAB — MICROSCOPIC EXAMINATION
Casts: NONE SEEN /lpf
Epithelial Cells (non renal): >10 /hpf — AB (ref 0–10)

## 2017-10-23 LAB — RUBELLA SCREEN: RUBELLA: 22.3 {index} (ref >0.99)

## 2017-10-23 LAB — VARICELLA ZOSTER ANTIBODY, IGG: VARICELLA: 1382 {index} (ref >165)

## 2017-10-23 LAB — HIV ANTIBODY (ROUTINE TESTING W REFLEX): HIV Screen 4th Generation wRfx: NONREACTIVE

## 2017-10-23 LAB — GC/CHLAMYDIA PROBE AMP (~~LOC~~) NOT AT ARMC
Chlamydia: NEGATIVE
Neisseria Gonorrhea: NEGATIVE

## 2017-10-23 LAB — BETA HCG QUANT (REF LAB): hCG Quant: 91397 m[IU]/mL

## 2017-10-25 ENCOUNTER — Encounter: Payer: Self-pay | Admitting: Certified Nurse Midwife

## 2017-10-25 LAB — CULTURE, OB URINE

## 2017-10-25 LAB — URINE CULTURE, OB REFLEX

## 2017-10-26 ENCOUNTER — Encounter: Payer: Self-pay | Admitting: Certified Nurse Midwife

## 2017-10-26 DIAGNOSIS — F1291 Cannabis use, unspecified, in remission: Secondary | ICD-10-CM | POA: Insufficient documentation

## 2017-10-26 DIAGNOSIS — Z87898 Personal history of other specified conditions: Secondary | ICD-10-CM | POA: Insufficient documentation

## 2017-10-26 LAB — DRUG PROFILE, UR, 9 DRUGS (LABCORP)
Amphetamines, Urine: NEGATIVE ng/mL
BARBITURATE QUANT UR: NEGATIVE ng/mL
Benzodiazepine Quant, Ur: NEGATIVE ng/mL
Cannabinoid Quant, Ur: POSITIVE — AB
Cocaine (Metab.): NEGATIVE ng/mL
Methadone Screen, Urine: NEGATIVE ng/mL
OPIATE QUANT UR: NEGATIVE ng/mL
PCP Quant, Ur: NEGATIVE ng/mL
Propoxyphene: NEGATIVE ng/mL

## 2017-10-26 LAB — NICOTINE SCREEN, URINE: COTININE UR QL SCN: POSITIVE ng/mL — AB

## 2017-11-05 ENCOUNTER — Ambulatory Visit (INDEPENDENT_AMBULATORY_CARE_PROVIDER_SITE_OTHER): Payer: Medicaid Other | Admitting: Certified Nurse Midwife

## 2017-11-05 ENCOUNTER — Encounter: Payer: Self-pay | Admitting: Certified Nurse Midwife

## 2017-11-05 VITALS — BP 113/73 | HR 94 | Wt 113.1 lb

## 2017-11-05 DIAGNOSIS — O219 Vomiting of pregnancy, unspecified: Secondary | ICD-10-CM | POA: Diagnosis not present

## 2017-11-05 DIAGNOSIS — Z3491 Encounter for supervision of normal pregnancy, unspecified, first trimester: Secondary | ICD-10-CM | POA: Diagnosis not present

## 2017-11-05 DIAGNOSIS — Z3A12 12 weeks gestation of pregnancy: Secondary | ICD-10-CM | POA: Diagnosis not present

## 2017-11-05 LAB — POCT URINALYSIS DIPSTICK
Bilirubin, UA: NEGATIVE
Glucose, UA: NEGATIVE
Ketones, UA: NEGATIVE
Leukocytes, UA: NEGATIVE
Nitrite, UA: NEGATIVE
Protein, UA: POSITIVE — AB
Spec Grav, UA: 1.02
Urobilinogen, UA: 0.2 U/dL
pH, UA: 5

## 2017-11-05 MED ORDER — DOXYLAMINE-PYRIDOXINE ER 20-20 MG PO TBCR: 1.0000 | EXTENDED_RELEASE_TABLET | Freq: Two times a day (BID) | ORAL | 4 refills | Status: DC

## 2017-11-05 MED ORDER — DOXYLAMINE-PYRIDOXINE 10-10 MG PO TBEC: 1.0000 | DELAYED_RELEASE_TABLET | Freq: Three times a day (TID) | ORAL | 3 refills | Status: DC

## 2017-11-05 NOTE — Patient Instructions (Signed)
Eating Plan for Pregnant Women While you are pregnant, your body will require additional nutrition to help support your growing baby. It is recommended that you consume:  150 additional calories each day during your first trimester.  300 additional calories each day during your second trimester.  300 additional calories each day during your third trimester.  Eating a healthy, well-balanced diet is very important for your health and for your baby's health. You also have a higher need for some vitamins and minerals, such as folic acid, calcium, iron, and vitamin D. What do I need to know about eating during pregnancy?  Do not try to lose weight or go on a diet during pregnancy.  Choose healthy, nutritious foods. Choose  of a sandwich with a glass of milk instead of a candy bar or a high-calorie sugar-sweetened beverage.  Limit your overall intake of foods that have "empty calories." These are foods that have little nutritional value, such as sweets, desserts, candies, sugar-sweetened beverages, and fried foods.  Eat a variety of foods, especially fruits and vegetables.  Take a prenatal vitamin to help meet the additional needs during pregnancy, specifically for folic acid, iron, calcium, and vitamin D.  Remember to stay active. Ask your health care provider for exercise recommendations that are specific to you.  Practice good food safety and cleanliness, such as washing your hands before you eat and after you prepare raw meat. This helps to prevent foodborne illnesses, such as listeriosis, that can be very dangerous for your baby. Ask your health care provider for more information about listeriosis. What does 150 extra calories look like? Healthy options for an additional 150 calories each day could be any of the following:  Plain low-fat yogurt (6-8 oz) with  cup of berries.  1 apple with 2 teaspoons of peanut butter.  Cut-up vegetables with  cup of hummus.  Low-fat chocolate milk  (8 oz or 1 cup).  1 string cheese with 1 medium orange.   of a peanut butter and jelly sandwich on whole-wheat bread (1 tsp of peanut butter).  For 300 calories, you could eat two of those healthy options each day. What is a healthy amount of weight to gain? The recommended amount of weight for you to gain is based on your pre-pregnancy BMI. If your pre-pregnancy BMI was:  Less than 18 (underweight), you should gain 28-40 lb.  18-24.9 (normal), you should gain 25-35 lb.  25-29.9 (overweight), you should gain 15-25 lb.  Greater than 30 (obese), you should gain 11-20 lb.  What if I am having twins or multiples? Generally, pregnant women who will be having twins or multiples may need to increase their daily calories by 300-600 calories each day. The recommended range for total weight gain is 25-54 lb, depending on your pre-pregnancy BMI. Talk with your health care provider for specific guidance about additional nutritional needs, weight gain, and exercise during your pregnancy. What foods can I eat? Grains Any grains. Try to choose whole grains, such as whole-wheat bread, oatmeal, or brown rice. Vegetables Any vegetables. Try to eat a variety of colors and types of vegetables to get a full range of vitamins and minerals. Remember to wash your vegetables well before eating. Fruits Any fruits. Try to eat a variety of colors and types of fruit to get a full range of vitamins and minerals. Remember to wash your fruits well before eating. Meats and Other Protein Sources Lean meats, including chicken, turkey, fish, and lean cuts of beef, veal,   or pork. Make sure that all meats are cooked to "well done." Tofu. Tempeh. Beans. Eggs. Peanut butter and other nut butters. Seafood, such as shrimp, crab, and lobster. If you choose fish, select types that are higher in omega-3 fatty acids, including salmon, herring, mussels, trout, sardines, and pollock. Make sure that all meats are cooked to food-safe  temperatures. Dairy Pasteurized milk and milk alternatives. Pasteurized yogurt and pasteurized cheese. Cottage cheese. Sour cream. Beverages Water. Juices that contain 100% fruit juice or vegetable juice. Caffeine-free teas and decaffeinated coffee. Drinks that contain caffeine are okay to drink, but it is better to avoid caffeine. Keep your total caffeine intake to less than 200 mg each day (12 oz of coffee, tea, or soda) or as directed by your health care provider. Condiments Any pasteurized condiments. Sweets and Desserts Any sweets and desserts. Fats and Oils Any fats and oils. The items listed above may not be a complete list of recommended foods or beverages. Contact your dietitian for more options. What foods are not recommended? Vegetables Unpasteurized (raw) vegetable juices. Fruits Unpasteurized (raw) fruit juices. Meats and Other Protein Sources Cured meats that have nitrates, such as bacon, salami, and hotdogs. Luncheon meats, bologna, or other deli meats (unless they are reheated until they are steaming hot). Refrigerated pate, meat spreads from a meat counter, smoked seafood that is found in the refrigerated section of a store. Raw fish, such as sushi or sashimi. High mercury content fish, such as tilefish, shark, swordfish, and king mackerel. Raw meats, such as tuna or beef tartare. Undercooked meats and poultry. Make sure that all meats are cooked to food-safe temperatures. Dairy Unpasteurized (raw) milk and any foods that have raw milk in them. Soft cheeses, such as feta, queso blanco, queso fresco, Brie, Camembert cheeses, blue-veined cheeses, and Panela cheese (unless it is made with pasteurized milk, which must be stated on the label). Beverages Alcohol. Sugar-sweetened beverages, such as sodas, teas, or energy drinks. Condiments Homemade fermented foods and drinks, such as pickles, sauerkraut, or kombucha drinks. (Store-bought pasteurized versions of these are  okay.) Other Salads that are made in the store, such as ham salad, chicken salad, egg salad, tuna salad, and seafood salad. The items listed above may not be a complete list of foods and beverages to avoid. Contact your dietitian for more information. This information is not intended to replace advice given to you by your health care provider. Make sure you discuss any questions you have with your health care provider. Document Released: 01/16/2014 Document Revised: 09/09/2015 Document Reviewed: 09/16/2013 Elsevier Interactive Patient Education  2018 [employer] Prenatal Care WHAT IS PRENATAL CARE? Prenatal care is the process of caring for a pregnant woman before she gives birth. Prenatal care makes sure that she and her baby remain as healthy as possible throughout pregnancy. Prenatal care may be provided by a midwife, family practice health care provider, or a childbirth and pregnancy specialist (obstetrician). Prenatal care may include physical examinations, testing, treatments, and education on nutrition, lifestyle, and social support services. WHY IS PRENATAL CARE SO IMPORTANT? Early and consistent prenatal care increases the chance that you and your baby will remain healthy throughout your pregnancy. This type of care also decreases a baby's risk of being born too early (prematurely), or being born smaller than expected (small for gestational age). Any underlying medical conditions you may have that could pose a risk during your pregnancy are discussed during prenatal care visits. You will also be monitored regularly for any new   conditions that may arise during your pregnancy so they can be treated quickly and effectively. WHAT HAPPENS DURING PRENATAL CARE VISITS? Prenatal care visits may include the following: Discussion Tell your health care provider about any new signs or symptoms you have experienced since your last visit. These might include:  Nausea or vomiting.  Increased or  decreased level of energy.  Difficulty sleeping.  Back or leg pain.  Weight changes.  Frequent urination.  Shortness of breath with physical activity.  Changes in your skin, such as the development of a rash or itchiness.  Vaginal discharge or bleeding.  Feelings of excitement or nervousness.  Changes in your baby's movements.  You may want to write down any questions or topics you want to discuss with your health care provider and bring them with you to your appointment. Examination During your first prenatal care visit, you will likely have a complete physical exam. Your health care provider will often examine your vagina, cervix, and the position of your uterus, as well as check your heart, lungs, and other body systems. As your pregnancy progresses, your health care provider will measure the size of your uterus and your baby's position inside your uterus. He or she may also examine you for early signs of labor. Your prenatal visits may also include checking your blood pressure and, after about 10-12 weeks of pregnancy, listening to your baby's heartbeat. Testing Regular testing often includes:  Urinalysis. This checks your urine for glucose, protein, or signs of infection.  Blood count. This checks the levels of white and red blood cells in your body.  Tests for sexually transmitted infections (STIs). Testing for STIs at the beginning of pregnancy is routinely done and is required in many states.  Antibody testing. You will be checked to see if you are immune to certain illnesses, such as rubella, that can affect a developing fetus.  Glucose screen. Around 24-28 weeks of pregnancy, your blood glucose level will be checked for signs of gestational diabetes. Follow-up tests may be recommended.  Group B strep. This is a bacteria that is commonly found inside a woman's vagina. This test will inform your health care provider if you need an antibiotic to reduce the amount of this  bacteria in your body prior to labor and childbirth.  Ultrasound. Many pregnant women undergo an ultrasound screening around 18-20 weeks of pregnancy to evaluate the health of the fetus and check for any developmental abnormalities.  HIV (human immunodeficiency virus) testing. Early in your pregnancy, you will be screened for HIV. If you are at high risk for HIV, this test may be repeated during your third trimester of pregnancy.  You may be offered other testing based on your age, personal or family medical history, or other factors. HOW OFTEN SHOULD I PLAN TO SEE MY HEALTH CARE PROVIDER FOR PRENATAL CARE? Your prenatal care check-up schedule depends on any medical conditions you have before, or develop during, your pregnancy. If you do not have any underlying medical conditions, you will likely be seen for checkups:  Monthly, during the first 6 months of pregnancy.  Twice a month during months 7 and 8 of pregnancy.  Weekly starting in the 9th month of pregnancy and until delivery.  If you develop signs of early labor or other concerning signs or symptoms, you may need to see your health care provider more often. Ask your health care provider what prenatal care schedule is best for you. WHAT CAN I DO TO KEEP MYSELF AND   MY BABY AS HEALTHY AS POSSIBLE DURING MY PREGNANCY?  Take a prenatal vitamin containing 400 micrograms (0.4 mg) of folic acid every day. Your health care provider may also ask you to take additional vitamins such as iodine, vitamin D, iron, copper, and zinc.  Take 1500-2000 mg of calcium daily starting at your 20th week of pregnancy until you deliver your baby.  Make sure you are up to date on your vaccinations. Unless directed otherwise by your health care provider: ? You should receive a tetanus, diphtheria, and pertussis (Tdap) vaccination between the 27th and 36th week of your pregnancy, regardless of when your last Tdap immunization occurred. This helps protect your baby  from whooping cough (pertussis) after he or she is born. ? You should receive an annual inactivated influenza vaccine (IIV) to help protect you and your baby from influenza. This can be done at any point during your pregnancy.  Eat a well-rounded diet that includes: ? Fresh fruits and vegetables. ? Lean proteins. ? Calcium-rich foods such as milk, yogurt, hard cheeses, and dark, leafy greens. ? Whole grain breads.  Do noteat seafood high in mercury, including: ? Swordfish. ? Tilefish. ? Shark. ? King mackerel. ? More than 6 oz tuna per week.  Do not eat: ? Raw or undercooked meats or eggs. ? Unpasteurized foods, such as soft cheeses (brie, blue, or feta), juices, and milks. ? Lunch meats. ? Hot dogs that have not been heated until they are steaming.  Drink enough water to keep your urine clear or pale yellow. For many women, this may be 10 or more 8 oz glasses of water each day. Keeping yourself hydrated helps deliver nutrients to your baby and may prevent the start of pre-term uterine contractions.  Do not use any tobacco products including cigarettes, chewing tobacco, or electronic cigarettes. If you need help quitting, ask your health care provider.  Do not drink beverages containing alcohol. No safe level of alcohol consumption during pregnancy has been determined.  Do not use any illegal drugs. These can harm your developing baby or cause a miscarriage.  Ask your health care provider or pharmacist before taking any prescription or over-the-counter medicines, herbs, or supplements.  Limit your caffeine intake to no more than 200 mg per day.  Exercise. Unless told otherwise by your health care provider, try to get 30 minutes of moderate exercise most days of the week. Do not  do high-impact activities, contact sports, or activities with a high risk of falling, such as horseback riding or downhill skiing.  Get plenty of rest.  Avoid anything that raises your body temperature,  such as hot tubs and saunas.  If you own a cat, do not empty its litter box. Bacteria contained in cat feces can cause an infection called toxoplasmosis. This can result in serious harm to the fetus.  Stay away from chemicals such as insecticides, lead, mercury, and cleaning or paint products that contain solvents.  Do not have any X-rays taken unless medically necessary.  Take a childbirth and breastfeeding preparation class. Ask your health care provider if you need a referral or recommendation.  This information is not intended to replace advice given to you by your health care provider. Make sure you discuss any questions you have with your health care provider. Document Released: 04/06/2003 Document Revised: 09/06/2015 Document Reviewed: 06/18/2013 Elsevier Interactive Patient Education  2017 [employer]  

## 2017-11-05 NOTE — Progress Notes (Signed)
NEW OB HISTORY AND PHYSICAL  SUBJECTIVE:       Mercedes Potter is a 27 y.o. [id number] female, Patient's last menstrual period was 07/31/2017., Estimated Date of Delivery: 05/16/18, [redacted]w[redacted]d, presents today for establishment of Prenatal Care. She complains of N&V    Gynecologic History Patient's last menstrual period was 07/31/2017. Normal Contraception: none Last Pap: . Results were: abnormal  Obstetric History OB History  Gravida Para Term Preterm AB Living  4       1 1   SAB TAB Ectopic Multiple Live Births  1       1    # Outcome Date GA Lbr Len/2nd Weight Sex Delivery Anes PTL Lv  4 Current           3 SAB           2 Gravida     M Vag-Spont   LIV  1 Gravida             Past Medical History:  Diagnosis Date  . Anemia     Past Surgical History:  Procedure Laterality Date  . INNER EAR SURGERY Right     Current Outpatient Medications on File Prior to Visit  Medication Sig Dispense Refill  . Prenatal Vit-Fe Fumarate-FA (PRENATAL MULTIVITAMIN) TABS tablet Take 1 tablet by mouth daily at 12 noon.     No current facility-administered medications on file prior to visit.     Allergies  Allergen Reactions  . Latex Itching  . Nickel Rash    Social History   Socioeconomic History  . Marital status: Single    Spouse name: Not on file  . Number of children: Not on file  . Years of education: Not on file  . Highest education level: Not on file  Occupational History  . Not on file  Social Needs  . Financial resource strain: Not on file  . Food insecurity:    Worry: Not on file    Inability: Not on file  . Transportation needs:    Medical: Not on file    Non-medical: Not on file  Tobacco Use  . Smoking status: Current Every Day Smoker    Packs/day: 0.50    Types: Cigarettes  . Smokeless tobacco: Never Used  Substance and Sexual Activity  . Alcohol use: Not Currently    Comment: Wine on 04/04/2015 had home UPT  . Drug use: Not Currently    Types: Marijuana   Comment: Last use was 02/2015  . Sexual activity: Yes    Birth control/protection: None  Lifestyle  . Physical activity:    Days per week: Not on file    Minutes per session: Not on file  . Stress: Not on file  Relationships  . Social connections:    Talks on phone: Not on file    Gets together: Not on file    Attends religious service: Not on file    Active member of club or organization: Not on file    Attends meetings of clubs or organizations: Not on file    Relationship status: Not on file  . Intimate partner violence:    Fear of current or ex partner: Not on file    Emotionally abused: Not on file    Physically abused: Not on file    Forced sexual activity: Not on file  Other Topics Concern  . Not on file  Social History Narrative  . Not on file    Family History  Problem Relation Age  of Onset  . Hypertension Father    Exercises daily Smokes a pack a day Admits to marijuana daily   The following portions of the patient's history were reviewed and updated as appropriate: allergies, current medications, past OB history, past medical history, past surgical history, past family history, past social history, and problem list.    OBJECTIVE: Initial Physical Exam (New OB)  GENERAL APPEARANCE: alert, well appearing, in no apparent distress, oriented to person, place and time HEAD: normocephalic, atraumatic MOUTH: mucous membranes moist, pharynx normal without lesions THYROID: no thyromegaly or masses present BREASTS: no masses noted, no significant tenderness, no palpable axillary nodes, no skin changes LUNGS: clear to auscultation, no wheezes, rales or rhonchi, symmetric air entry HEART: regular rate and rhythm, no murmurs ABDOMEN: soft, nontender, nondistended, no abnormal masses, no epigastric pain and FHT present EXTREMITIES: no redness or tenderness in the calves or thighs, no limitation in range of motion, intact peripheral pulses SKIN: normal coloration and  turgor, no rashes LYMPH NODES: no adenopathy palpable NEUROLOGIC: alert, oriented, normal speech, no focal findings or movement disorder noted FHT:[id number]  PELVIC EXAM EXTERNAL GENITALIA: normal appearing vulva with no masses, tenderness or lesions VAGINA: no abnormal discharge or lesions CERVIX: no lesions or cervical motion tenderness UTERUS: gravid ADNEXA: no masses palpable and nontender OB EXAM PELVIMETRY: appears adequate RECTUM: exam not indicated  ASSESSMENT: Normal pregnancy  PLAN: New OB counseling: The patient has been given an overview regarding routine prenatal care. Recommendations regarding diet, weight gain, and exercise in pregnancy were given. Prenatal testing, optional genetic testing, and ultrasound use in pregnancy were reviewed. Panorama testing today. Benefits of Breast Feeding were discussed. The patient is encouraged to consider nursing her baby post partum. Information on smoking in pregnancy given. Pt encouraged to stop   Doreene BurkeAnnie Amelie Caracci, CNM

## 2017-11-05 NOTE — Addendum Note (Signed)
Addended by: Brooke DareSICK, Cherysh Epperly L on: 11/05/2017 03:33 PM   Modules accepted: Orders

## 2017-11-05 NOTE — Addendum Note (Signed)
Addended by: Brooke DareSICK, Yoniel Arkwright L on: 11/05/2017 04:56 PM   Modules accepted: Orders

## 2017-11-05 NOTE — Progress Notes (Signed)
NOB physical.

## 2017-11-07 LAB — PAP IG, CT-NG, RFX HPV ASCU
Chlamydia, Nuc. Acid Amp: NEGATIVE
Gonococcus by Nucleic Acid Amp: NEGATIVE
PAP SMEAR COMMENT: 0

## 2017-11-12 ENCOUNTER — Telehealth: Payer: Self-pay | Admitting: Certified Nurse Midwife

## 2017-11-12 ENCOUNTER — Encounter (INDEPENDENT_AMBULATORY_CARE_PROVIDER_SITE_OTHER): Payer: Self-pay

## 2017-11-12 NOTE — Telephone Encounter (Signed)
The patient called and stated that she received her results in MyChart and would like to speak with a nurse if possible to go over the results. Please advise.

## 2017-12-04 ENCOUNTER — Ambulatory Visit (INDEPENDENT_AMBULATORY_CARE_PROVIDER_SITE_OTHER): Payer: Medicaid Other | Admitting: Obstetrics and Gynecology

## 2017-12-04 VITALS — BP 112/67 | HR 97 | Wt 117.2 lb

## 2017-12-04 DIAGNOSIS — Z3492 Encounter for supervision of normal pregnancy, unspecified, second trimester: Secondary | ICD-10-CM

## 2017-12-04 LAB — POCT URINALYSIS DIPSTICK OB
Bilirubin, UA: NEGATIVE
Glucose, UA: NEGATIVE — AB
KETONES UA: NEGATIVE
LEUKOCYTES UA: NEGATIVE
NITRITE UA: NEGATIVE
PH UA: 6.5 (ref 5.0–8.0)
Spec Grav, UA: 1.01 (ref 1.010–1.025)
UROBILINOGEN UA: 0.2 U/dL

## 2017-12-04 NOTE — Progress Notes (Signed)
ROB-doing well, anatomy scan next visit, FT student, has cut smoking down to 1/2 ppd from 2 ppd. Is working towards stopping.

## 2017-12-04 NOTE — Progress Notes (Signed)
ROB- pt is doing well 

## 2018-01-03 ENCOUNTER — Ambulatory Visit (INDEPENDENT_AMBULATORY_CARE_PROVIDER_SITE_OTHER): Payer: Medicaid Other | Admitting: Certified Nurse Midwife

## 2018-01-03 ENCOUNTER — Ambulatory Visit (INDEPENDENT_AMBULATORY_CARE_PROVIDER_SITE_OTHER): Payer: Medicaid Other

## 2018-01-03 VITALS — BP 110/81 | HR 79 | Wt 124.0 lb

## 2018-01-03 DIAGNOSIS — Z3A20 20 weeks gestation of pregnancy: Secondary | ICD-10-CM

## 2018-01-03 DIAGNOSIS — Z3492 Encounter for supervision of normal pregnancy, unspecified, second trimester: Secondary | ICD-10-CM

## 2018-01-03 DIAGNOSIS — O4402 Placenta previa specified as without hemorrhage, second trimester: Secondary | ICD-10-CM

## 2018-01-03 DIAGNOSIS — O4400 Placenta previa specified as without hemorrhage, unspecified trimester: Secondary | ICD-10-CM

## 2018-01-03 LAB — POCT URINALYSIS DIPSTICK OB
Bilirubin, UA: NEGATIVE
GLUCOSE, UA: NEGATIVE
Ketones, UA: NEGATIVE
LEUKOCYTES UA: NEGATIVE
NITRITE UA: NEGATIVE
PH UA: 7 (ref 5.0–8.0)
POC,PROTEIN,UA: NEGATIVE
SPEC GRAV UA: 1.01 (ref 1.010–1.025)
UROBILINOGEN UA: 0.2 U/dL

## 2018-01-03 NOTE — Progress Notes (Signed)
ROB-Doing well. Anatomy scan today, findings reviewed with patient. Bleeding precautions and plan of care given;verbalized understanding. Reviewed red flag symptoms and when to call. RTC x 4 weeks for ROB or sooner if needed.   ULTRASOUND REPORT  Location: [facility] Date of Service:  01/03/2018  Indications: Anatomy Findings:  Singleton intrauterine pregnancy is visualized with FHR at 140 BPM. Biometrics give an (U/S) Gestational age of 27 5/7 weeks and an (U/S) EDD of 05/18/18; this correlates with the clinically established EDD of 05/16/18.  Fetal presentation is variable breech positions.  EFW: 388 grams (0lb 14oz). Placenta: Posterior and grade 2.  Complete previa. AFI: WNL subjectively.  Anatomic survey is complete and appears WNL; Gender - Female.   Right Ovary measures 1.9 x 1.6 x 1.3 cm. It is normal in appearance. Left Ovary measures 2.5 x 1.9 x 1.0 cm. It is normal appearance. There is no obvious evidence of a corpus luteal cyst. Survey of the adnexa demonstrates no adnexal masses. There is no free peritoneal fluid in the cul de sac.  Impression: 1. 20 5/7 week Viable Singleton Intrauterine pregnancy by U/S. 2. (U/S) EDD is consistent with Clinically established (LMP) EDD of 05/16/18. 3. Normal Anatomy Scan 4. Complete placenta previa.  Recommendations: 1.Clinical correlation with the patient's History and Physical Exam. 2. Repeat U/S at 28-[redacted] weeks gestation to reassess placenta location.

## 2018-01-03 NOTE — Patient Instructions (Signed)
Placenta Previa °Placenta previa is a condition in which the placenta implants in the lower part of the uterus in pregnant women. The placenta either partially or completely covers the opening to the cervix. This is a problem because the baby must pass through the cervix during delivery. There are three types of placenta previa: °· Marginal placenta previa. The placenta reaches within an inch (2.5 cm) of the cervical opening but does not cover it. °· Partial placenta previa. The placenta covers part of the cervical opening. °· Complete placenta previa. The placenta covers the entire cervical opening. ° °If the previa is marginal or partial and it is diagnosed in the first half of pregnancy, the placenta may move into a normal position as the pregnancy progresses and may no longer cover the cervix. It is important to keep all prenatal visits with your health care provider so you can be more closely monitored. °What are the causes? °The cause of this condition is not known. °What increases the risk? °This condition is more likely to develop in women who: °· Are carrying more than one baby (multiples). °· Have an abnormally shaped uterus. °· Have scars on the lining of the uterus. °· Have had surgeries involving the uterus, such as a cesarean delivery. °· Have delivered a baby before. °· Have a history of placenta previa. °· Have smoked or used cocaine during pregnancy. °· Are age 35 or older during pregnancy. ° °What are the signs or symptoms? °The main symptom of this condition is sudden, painless vaginal bleeding during the second half of pregnancy. The amount of bleeding can be very light at first, and it usually stops on its own. Heavier bleeding episodes may also happen. Some women with placenta previa may have no bleeding at all. °How is this diagnosed? °· This condition is diagnosed: °? From an ultrasound. This test uses sound waves to find where the placenta is located before you have any bleeding  episodes. °? During a checkup after vaginal bleeding is noticed. °· If you are diagnosed with a partial or complete previa, digital exams with fingers will generally be avoided. Your health care provider will still perform a speculum exam. °· If you did not have an ultrasound during your pregnancy, placenta previa may not be diagnosed until bleeding occurs during labor. °How is this treated? °Treatment for this condition may include: °· Decreased activity. °· Bed rest at home or in the hospital. °· Pelvic rest. Nothing is placed inside the vagina during pelvic rest. This means not having sex and not using tampons or douches. °· A blood transfusion to replace blood that you have lost (maternal blood loss). °· A cesarean delivery. This may be performed if: °? The bleeding is heavy and cannot be controlled. °? The placenta completely covers the cervix. °· Medicines to stop premature labor or to help the baby's lungs to mature. This treatment may be used if you need delivery before your pregnancy is full-term. ° °Your treatment will be decided based on: °· How much you are bleeding, or whether the bleeding has stopped. °· How far along you are in your pregnancy. °· The condition of your baby. °· The type of placenta previa that you have. ° °Follow these instructions at home: °· Get plenty of rest and lessen activity as told by your health care provider. °· Stay on bed rest for as long as told by your health care provider. °· Do not have sex, use tampons, use a douche, or place   anything inside of your vagina if your health care provider recommended pelvic rest. °· Take over-the-counter and prescription medicines as told by your health care provider. °· Keep all follow-up visits as told by your health care provider. This is important. °Get help right away if: °· You have vaginal bleeding, even if in small amounts and even if you have no pain. °· You have cramping or regular contractions. °· You have pain in your abdomen  or your lower back. °· You have a feeling of increased pressure in your pelvis. °· You have increased watery or bloody mucus from the vagina. °This information is not intended to replace advice given to you by your health care provider. Make sure you discuss any questions you have with your health care provider. °Document Released: 04/03/2005 Document Revised: 12/22/2015 Document Reviewed: 10/16/2015 °Elsevier Interactive Patient Education © 2018 [employer] ° °

## 2018-01-09 ENCOUNTER — Other Ambulatory Visit: Payer: Self-pay | Admitting: Obstetrics and Gynecology

## 2018-01-21 ENCOUNTER — Encounter: Payer: Self-pay | Admitting: Certified Nurse Midwife

## 2018-01-30 ENCOUNTER — Encounter: Payer: Self-pay | Admitting: Certified Nurse Midwife

## 2018-01-30 ENCOUNTER — Ambulatory Visit (INDEPENDENT_AMBULATORY_CARE_PROVIDER_SITE_OTHER): Payer: Medicaid Other | Admitting: Certified Nurse Midwife

## 2018-01-30 VITALS — BP 104/70 | HR 87 | Wt 132.5 lb

## 2018-01-30 DIAGNOSIS — Z3492 Encounter for supervision of normal pregnancy, unspecified, second trimester: Secondary | ICD-10-CM

## 2018-01-30 NOTE — Patient Instructions (Signed)

## 2018-01-30 NOTE — Progress Notes (Signed)
ROB, doing well. No complaints. Discussed glucose screening and u/s for placenta previa. She verbalizes and agrees to plan. Follow up 3 wk.   Doreene Burke, CNM

## 2018-02-01 ENCOUNTER — Encounter: Payer: Medicaid Other | Admitting: Certified Nurse Midwife

## 2018-02-19 ENCOUNTER — Ambulatory Visit: Payer: Medicaid Other

## 2018-02-19 ENCOUNTER — Ambulatory Visit (INDEPENDENT_AMBULATORY_CARE_PROVIDER_SITE_OTHER): Payer: Medicaid Other

## 2018-02-19 ENCOUNTER — Ambulatory Visit (INDEPENDENT_AMBULATORY_CARE_PROVIDER_SITE_OTHER): Payer: Medicaid Other | Admitting: Obstetrics and Gynecology

## 2018-02-19 VITALS — BP 114/76 | HR 88 | Wt 136.5 lb

## 2018-02-19 DIAGNOSIS — Z3492 Encounter for supervision of normal pregnancy, unspecified, second trimester: Secondary | ICD-10-CM

## 2018-02-19 DIAGNOSIS — O4443 Low lying placenta NOS or without hemorrhage, third trimester: Secondary | ICD-10-CM

## 2018-02-19 DIAGNOSIS — Z130 Encounter for screening for diseases of the blood and blood-forming organs and certain disorders involving the immune mechanism: Secondary | ICD-10-CM

## 2018-02-19 DIAGNOSIS — Z3A28 28 weeks gestation of pregnancy: Secondary | ICD-10-CM

## 2018-02-19 DIAGNOSIS — Z131 Encounter for screening for diabetes mellitus: Secondary | ICD-10-CM

## 2018-02-19 LAB — POCT URINALYSIS DIPSTICK OB
Bilirubin, UA: NEGATIVE
Glucose, UA: NEGATIVE
KETONES UA: NEGATIVE
LEUKOCYTES UA: NEGATIVE
NITRITE UA: NEGATIVE
Spec Grav, UA: 1.01 (ref 1.010–1.025)
Urobilinogen, UA: 0.2 E.U./dL
pH, UA: 7.5 (ref 5.0–8.0)

## 2018-02-19 NOTE — Progress Notes (Signed)
ROB- glucola done today, blood consent, anatomy scan follow up, she is doing well

## 2018-02-19 NOTE — Patient Instructions (Signed)

## 2018-02-19 NOTE — Progress Notes (Signed)
ROB and follow up u/s- doing well.  Ultrasound reveals resolution of previa but placenta is still low lying:  Indications: Growth; F/U Placenta Previa Findings:  Singleton intrauterine pregnancy is visualized with FHR at 137 BPM. Biometrics give an (U/S) Gestational age of [redacted] weeks and an (U/S) EDD of 05/14/18; this correlates with the clinically established EDD of 05/16/18.  Fetal presentation is breech.  EFW: 1113 grams (2lb 7oz).  36th percentile. Placenta: Posterior and grade 1.  Placenta has moved away from being a previa, but it is still low-lying at only 0.7 cm from cervical os. AFI: 14.2 cm.  Fetal stomach, kidneys, and bladder appear WNL.  Impression: 1. 28 week Viable Singleton Intrauterine pregnancy by U/S. 2. (U/S) EDD is consistent with Clinically established (LMP) EDD of 05/16/18. 3. EFW: 1113 grams (2lb 7oz).  36th percentile. 4. Breech presentation. 5. Placenta is no longer a previa, but still low-lying.

## 2018-02-20 LAB — GLUCOSE, 1 HOUR GESTATIONAL: Gestational Diabetes Screen: 89 mg/dL (ref 65–139)

## 2018-03-11 ENCOUNTER — Encounter: Payer: Medicaid Other | Admitting: Certified Nurse Midwife

## 2018-03-11 ENCOUNTER — Other Ambulatory Visit: Payer: Medicaid Other

## 2018-03-21 ENCOUNTER — Other Ambulatory Visit: Payer: Medicaid Other

## 2018-03-21 ENCOUNTER — Ambulatory Visit (INDEPENDENT_AMBULATORY_CARE_PROVIDER_SITE_OTHER): Payer: Medicaid Other | Admitting: Certified Nurse Midwife

## 2018-03-21 VITALS — BP 114/69 | HR 95 | Wt 139.7 lb

## 2018-03-21 DIAGNOSIS — Z3493 Encounter for supervision of normal pregnancy, unspecified, third trimester: Secondary | ICD-10-CM

## 2018-03-21 DIAGNOSIS — Z130 Encounter for screening for diseases of the blood and blood-forming organs and certain disorders involving the immune mechanism: Secondary | ICD-10-CM

## 2018-03-21 DIAGNOSIS — O26893 Other specified pregnancy related conditions, third trimester: Secondary | ICD-10-CM

## 2018-03-21 DIAGNOSIS — R319 Hematuria, unspecified: Secondary | ICD-10-CM

## 2018-03-21 DIAGNOSIS — O4443 Low lying placenta NOS or without hemorrhage, third trimester: Secondary | ICD-10-CM

## 2018-03-21 LAB — POCT URINALYSIS DIPSTICK OB
BILIRUBIN UA: NEGATIVE
Glucose, UA: NEGATIVE
KETONES UA: NEGATIVE
LEUKOCYTES UA: NEGATIVE
Nitrite, UA: NEGATIVE
Urobilinogen, UA: 0.2 E.U./dL
pH, UA: 6 (ref 5.0–8.0)

## 2018-03-21 MED ORDER — VITAFOL GUMMIES 3.33-0.333-34.8 MG PO CHEW: 3.0000 | CHEWABLE_TABLET | Freq: Every day | ORAL | 4 refills | Status: DC

## 2018-03-21 NOTE — Progress Notes (Signed)
ROB-Doing well, no questions. Hematuria on dip; will send culture; see orders. Handout given for [employer] [facility]" for optimization of fetal positioning. Anticipatory guidance regarding course of prenatal care. Reviewed red flag symptoms and when to call. RTC x 1 week for follow up ultrasound due to low lying placenta. RTC x 2 weeks for ROB or sooner if needed.

## 2018-03-21 NOTE — Progress Notes (Signed)
ROB, no complaints.  

## 2018-03-21 NOTE — Patient Instructions (Signed)
Third Trimester of Pregnancy  The third trimester is from week 29 through week 42, months 7 through 9. This trimester is when your unborn baby (fetus) is growing very fast. At the end of the ninth month, the unborn baby is about 20 inches in length. It weighs about 6–10 pounds.  Follow these instructions at home:  · Avoid all smoking, herbs, and alcohol. Avoid drugs not approved by your doctor.  · Do not use any tobacco products, including cigarettes, chewing tobacco, and electronic cigarettes. If you need help quitting, ask your doctor. You may get counseling or other support to help you quit.  · Only take medicine as told by your doctor. Some medicines are safe and some are not during pregnancy.  · Exercise only as told by your doctor. Stop exercising if you start having cramps.  · Eat regular, healthy meals.  · Wear a good support bra if your breasts are tender.  · Do not use hot tubs, steam rooms, or saunas.  · Wear your seat belt when driving.  · Avoid raw meat, uncooked cheese, and liter boxes and soil used by cats.  · Take your prenatal vitamins.  · Take 1500–2000 milligrams of calcium daily starting at the 20th week of pregnancy until you deliver your baby.  · Try taking medicine that helps you poop (stool softener) as needed, and if your doctor approves. Eat more fiber by eating fresh fruit, vegetables, and whole grains. Drink enough fluids to keep your pee (urine) clear or pale yellow.  · Take warm water baths (sitz baths) to soothe pain or discomfort caused by hemorrhoids. Use hemorrhoid cream if your doctor approves.  · If you have puffy, bulging veins (varicose veins), wear support hose. Raise (elevate) your feet for 15 minutes, 3–4 times a day. Limit salt in your diet.  · Avoid heavy lifting, wear low heels, and sit up straight.  · Rest with your legs raised if you have leg cramps or low back pain.  · Visit your dentist if you have not gone during your pregnancy. Use a  soft toothbrush to brush your teeth. Be gentle when you floss.  · You can have sex (intercourse) unless your doctor tells you not to.  · Do not travel far distances unless you must. Only do so with your doctor's approval.  · Take prenatal classes.  · Practice driving to the hospital.  · Pack your hospital bag.  · Prepare the baby's room.  · Go to your doctor visits.  Get help if:  · You are not sure if you are in labor or if your water has broken.  · You are dizzy.  · You have mild cramps or pressure in your lower belly (abdominal).  · You have a nagging pain in your belly area.  · You continue to feel sick to your stomach (nauseous), throw up (vomit), or have watery poop (diarrhea).  · You have bad smelling fluid coming from your vagina.  · You have pain with peeing (urination).  Get help right away if:  · You have a fever.  · You are leaking fluid from your vagina.  · You are spotting or bleeding from your vagina.  · You have severe belly cramping or pain.  · You lose or gain weight rapidly.  · You have trouble catching your breath and have chest pain.  · You notice sudden or extreme puffiness (swelling) of your face, hands, ankles, feet, or legs.  · You have   not felt the baby move in over an hour.  · You have severe headaches that do not go away with medicine.  · You have vision changes.  This information is not intended to replace advice given to you by your health care provider. Make sure you discuss any questions you have with your health care provider.  Document Released: 06/28/2009 Document Revised: 09/09/2015 Document Reviewed: 06/04/2012  Elsevier Interactive Patient Education © 2017 [employer]          Fetal Movement Counts  Patient Name: ________________________________________________ Patient Due Date: ____________________  What is a fetal movement count?  A fetal movement count is the number of times that you feel your baby move during a certain amount of time. This may also be called a fetal kick  count. A fetal movement count is recommended for every pregnant woman. You may be asked to start counting fetal movements as early as week 28 of your pregnancy.  Pay attention to when your baby is most active. You may notice your baby's sleep and wake cycles. You may also notice things that make your baby move more. You should do a fetal movement count:  · When your baby is normally most active.  · At the same time each day.    A good time to count movements is while you are resting, after having something to eat and drink.  How do I count fetal movements?  1. Find a quiet, comfortable area. Sit, or lie down on your side.  2. Write down the date, the start time and stop time, and the number of movements that you felt between those two times. Take this information with you to your health care visits.  3. For 2 hours, count kicks, flutters, swishes, rolls, and jabs. You should feel at least 10 movements during 2 hours.  4. You may stop counting after you have felt 10 movements.  5. If you do not feel 10 movements in 2 hours, have something to eat and drink. Then, keep resting and counting for 1 hour. If you feel at least 4 movements during that hour, you may stop counting.  Contact a health care provider if:  · You feel fewer than 4 movements in 2 hours.  · Your baby is not moving like he or she usually does.  Date: ____________ Start time: ____________ Stop time: ____________ Movements: ____________  Date: ____________ Start time: ____________ Stop time: ____________ Movements: ____________  Date: ____________ Start time: ____________ Stop time: ____________ Movements: ____________  Date: ____________ Start time: ____________ Stop time: ____________ Movements: ____________  Date: ____________ Start time: ____________ Stop time: ____________ Movements: ____________  Date: ____________ Start time: ____________ Stop time: ____________ Movements: ____________   Date: ____________ Start time: ____________ Stop time: ____________ Movements: ____________  Date: ____________ Start time: ____________ Stop time: ____________ Movements: ____________  Date: ____________ Start time: ____________ Stop time: ____________ Movements: ____________  This information is not intended to replace advice given to you by your health care provider. Make sure you discuss any questions you have with your health care provider.  Document Released: 05/03/2006 Document Revised: 12/01/2015 Document Reviewed: 05/13/2015  Elsevier Interactive Patient Education © 2018 [employer] Inc[employer]

## 2018-03-22 LAB — RPR: RPR Ser Ql: NONREACTIVE

## 2018-03-22 LAB — CBC
HEMATOCRIT: 28.4 % — AB (ref 34.0–46.6)
HEMOGLOBIN: 9.8 g/dL — AB (ref 11.1–15.9)
MCH: 33.1 pg — AB (ref 26.6–33.0)
MCHC: 34.5 g/dL (ref 31.5–35.7)
MCV: 96 fL (ref 79–97)
Platelets: 350 10*3/uL (ref 150–450)
RBC: 2.96 x10E6/uL — AB (ref 3.77–5.28)
RDW: 12.2 % — ABNORMAL LOW (ref 12.3–15.4)
WBC: 10.3 10*3/uL (ref 3.4–10.8)

## 2018-03-22 NOTE — Progress Notes (Signed)
Please contact patient. Anemic needs iron supplementation. Discussed food options. Encourage MyChart activation. Thanks, JML

## 2018-03-25 ENCOUNTER — Telehealth: Payer: Self-pay

## 2018-03-25 NOTE — Telephone Encounter (Signed)
Line continues to be busy. 

## 2018-03-25 NOTE — Telephone Encounter (Signed)
Recording states this line is busy.

## 2018-03-25 NOTE — Telephone Encounter (Signed)
Line remains busy.

## 2018-03-26 ENCOUNTER — Telehealth: Payer: Self-pay

## 2018-03-26 NOTE — Telephone Encounter (Signed)
Recording states this line is busy. Letter sent.

## 2018-03-27 ENCOUNTER — Ambulatory Visit (INDEPENDENT_AMBULATORY_CARE_PROVIDER_SITE_OTHER): Payer: Medicaid Other

## 2018-03-27 ENCOUNTER — Other Ambulatory Visit: Payer: Self-pay | Admitting: Obstetrics and Gynecology

## 2018-03-27 DIAGNOSIS — Z3A32 32 weeks gestation of pregnancy: Secondary | ICD-10-CM

## 2018-03-27 DIAGNOSIS — O4403 Placenta previa specified as without hemorrhage, third trimester: Secondary | ICD-10-CM | POA: Diagnosis not present

## 2018-04-08 ENCOUNTER — Ambulatory Visit (INDEPENDENT_AMBULATORY_CARE_PROVIDER_SITE_OTHER): Payer: Medicaid Other | Admitting: Certified Nurse Midwife

## 2018-04-08 VITALS — BP 109/73 | HR 107 | Wt 142.4 lb

## 2018-04-08 DIAGNOSIS — Z3493 Encounter for supervision of normal pregnancy, unspecified, third trimester: Secondary | ICD-10-CM

## 2018-04-08 LAB — POCT URINALYSIS DIPSTICK OB
Bilirubin, UA: NEGATIVE
GLUCOSE, UA: NEGATIVE
Ketones, UA: NEGATIVE
LEUKOCYTES UA: NEGATIVE
Nitrite, UA: NEGATIVE
Spec Grav, UA: 1.01 (ref 1.010–1.025)
Urobilinogen, UA: 0.2 E.U./dL
pH, UA: 8.5 — AB (ref 5.0–8.0)

## 2018-04-08 NOTE — Patient Instructions (Signed)
Group B Streptococcus Infection During Pregnancy  Group B Streptococcus (GBS) is a type of bacteria (Streptococcus agalactiae) that is often found in healthy people, commonly in the rectum, vagina, and intestines. In people who are healthy and not pregnant, the bacteria rarely cause serious illness or complications. However, women who test positive for GBS during pregnancy can pass the bacteria to their baby during childbirth, which can cause serious infection in the baby after birth. Women with GBS may also have infections during their pregnancy or immediately after childbirth, such as such as urinary tract infections (UTIs) or infections of the uterus (uterine infections). Having GBS also increases a woman's risk of complications during pregnancy, such as early (preterm) labor or delivery, miscarriage, or stillbirth. Routine testing (screening) for GBS is recommended for all pregnant women. What increases the risk? You may have a higher risk for GBS infection during pregnancy if you had one during a past pregnancy. What are the signs or symptoms? In most cases, GBS infection does not cause symptoms in pregnant women. Signs and symptoms of a possible GBS-related infection may include:  Labor starting before the 37th week of pregnancy.  A UTI or bladder infection, which may cause: ? Fever. ? Pain or burning during urination. ? Frequent urination.  Fever during labor, along with: ? Bad-smelling discharge. ? Uterine tenderness. ? Rapid heartbeat in the mother, baby, or both. Rare but serious symptoms of a possible GBS-related infection in women include:  Blood infection (septicemia). This may cause fever, chills, or confusion.  Lung infection (pneumonia). This may cause fever, chills, cough, rapid breathing, difficulty breathing, or chest pain.  Bone, joint, skin, or soft tissue infection. How is this diagnosed? You may be screened for GBS between week 35 and week 37 of your pregnancy. If  you have symptoms of preterm labor, you may be screened earlier. This condition is diagnosed based on lab test results from:  A swab of fluid from the vagina and rectum.  A urine sample. How is this treated? This condition is treated with antibiotic medicine. When you go into labor, or as soon as your water breaks (your membranes rupture), you will be given antibiotics through an IV tube. Antibiotics will continue until after you give birth. If you are having a cesarean delivery, you do not need antibiotics unless your membranes have already ruptured. Follow these instructions at home:  Take over-the-counter and prescription medicines only as told by your health care provider.  Take your antibiotic medicine as told by your health care provider. Do not stop taking the antibiotic even if you start to feel better.  Keep all pre-birth (prenatal) visits and follow-up visits as told by your health care provider. This is important. Contact a health care provider if:  You have pain or burning when you urinate.  You have to urinate frequently.  You have a fever or chills.  You develop a bad-smelling vaginal discharge. Get help right away if:  Your membranes rupture.  You go into labor.  You have severe pain in your abdomen.  You have difficulty breathing.  You have chest pain. This information is not intended to replace advice given to you by your health care provider. Make sure you discuss any questions you have with your health care provider. Document Released: 07/11/2007 Document Revised: 10/29/2015 Document Reviewed: 10/28/2015 Elsevier Interactive Patient Education  2019 [employer] Inc.  

## 2018-04-08 NOTE — Progress Notes (Signed)
ROB doing well. No complaints. U/s done at 32 wk shows resolution of LLP. See results. Discussed with pt. Feels good movement. Reviewed GBS at next visit. Follow up 1 wk.   Doreene BurkeAnnie Carter Kassel, CNM

## 2018-04-16 ENCOUNTER — Ambulatory Visit (INDEPENDENT_AMBULATORY_CARE_PROVIDER_SITE_OTHER): Payer: Medicaid Other | Admitting: Obstetrics and Gynecology

## 2018-04-16 VITALS — BP 118/73 | HR 94 | Wt 145.4 lb

## 2018-04-16 DIAGNOSIS — Z3493 Encounter for supervision of normal pregnancy, unspecified, third trimester: Secondary | ICD-10-CM

## 2018-04-16 DIAGNOSIS — Z113 Encounter for screening for infections with a predominantly sexual mode of transmission: Secondary | ICD-10-CM

## 2018-04-16 DIAGNOSIS — Z3685 Encounter for antenatal screening for Streptococcus B: Secondary | ICD-10-CM

## 2018-04-16 NOTE — Progress Notes (Signed)
ROB and cultures-cultures obtained- Microscopic wet-mount exam shows negative for pathogens, normal epithelial cells. States external vaginal itching- worse after bubble baths, also notes an increased in warts- reassured of normal findings, discouraged bubble baths in general, ok to use topical tea tree oils. Labor precautions discussed.

## 2018-04-16 NOTE — Addendum Note (Signed)
Addended by: Rosine BeatLONTZ, AMY L on: 04/16/2018 04:19 PM   Modules accepted: Orders

## 2018-04-16 NOTE — Progress Notes (Signed)
ROB- cultures obtained, pt is having a lot of pelvic pressure 

## 2018-04-17 NOTE — L&D Delivery Note (Addendum)
     Delivery Note   Mercedes Potter is a 28 y.o. [id number] at [redacted]w[redacted]d Estimated Date of Delivery: 05/16/18  PRE-OPERATIVE DIAGNOSIS:  1) [redacted]w[redacted]d pregnancy.   POST-OPERATIVE DIAGNOSIS:  1) [redacted]w[redacted]d pregnancy s/p Vaginal, Spontaneous   Delivery Type: Vaginal, Spontaneous    Delivery Anesthesia: Epidural   Labor Complications:   prolonged rupture of membranes    ESTIMATED BLOOD LOSS: 915 ml    FINDINGS:   1) female infant, Apgar scores of 9    at 1 minute and 9    at 5 minutes. Birthweight pending , infant skin to skin 2) Nuchal cord: no  SPECIMENS:   PLACENTA:   Appearance: Intact , 3 vessel cord, classifications noted   Removal: Spontaneous      Disposition:    Held per protocol then discarded DISPOSITION:  Infant to left in stable condition in the delivery room, with L&D personnel and mother,  NARRATIVE SUMMARY: Labor course:  [name] is a [id number] at [redacted]w[redacted]d who presented for SROM, augmentation of labor.  She progressed well in labor with pitocin.  She received the appropriate anesthesia and proceeded to complete dilation. She evidenced good maternal expulsive effort during the second stage. She went on to deliver a viable female infant "Layla" in LOA position. The placenta delivered without problems and was noted to be complete. 800 mcg of Cytotec placed rectally for bleeding. Along with one dose of IM methergine. Bimanual exam completed for a removal of large clot in lower uterine segment. Afterwards bleeding decreased. A perineal and vaginal examination was performed. Episiotomy/Lacerations: 1st degree;Perineal   Episiotomy repairedd with 3-0 Vicryl Rapide suture. The patient tolerated this well.  Doreene Burke, CNM  05/17/2018 4:23 AM

## 2018-04-18 LAB — STREP GP B NAA: Strep Gp B NAA: NEGATIVE

## 2018-04-19 LAB — GC/CHLAMYDIA PROBE AMP
CHLAMYDIA, DNA PROBE: NEGATIVE
Neisseria gonorrhoeae by PCR: NEGATIVE

## 2018-04-23 ENCOUNTER — Encounter: Payer: Self-pay | Admitting: Certified Nurse Midwife

## 2018-04-23 ENCOUNTER — Ambulatory Visit (INDEPENDENT_AMBULATORY_CARE_PROVIDER_SITE_OTHER): Payer: Medicaid Other | Admitting: Certified Nurse Midwife

## 2018-04-23 VITALS — BP 118/79 | HR 96 | Wt 145.3 lb

## 2018-04-23 DIAGNOSIS — Z3493 Encounter for supervision of normal pregnancy, unspecified, third trimester: Secondary | ICD-10-CM | POA: Diagnosis not present

## 2018-04-23 LAB — POCT URINALYSIS DIPSTICK OB
Bilirubin, UA: NEGATIVE
Glucose, UA: NEGATIVE
Ketones, UA: NEGATIVE
Leukocytes, UA: NEGATIVE
Nitrite, UA: NEGATIVE
Spec Grav, UA: 1.02 (ref 1.010–1.025)
Urobilinogen, UA: 0.2 E.U./dL
pH, UA: 7 (ref 5.0–8.0)

## 2018-04-23 NOTE — Progress Notes (Signed)
ROB-Reports intermittent pelvic pressure, requests SVE. GBS negative status reviewed. Education regarding home labor preparation techniques. Reviewed red flag symptoms and when to call. RTC x 1 week for ROB or sooner if needed.

## 2018-04-23 NOTE — Progress Notes (Signed)
ROB, c/o intermittent pelvic pressure.

## 2018-04-23 NOTE — Patient Instructions (Signed)

## 2018-04-30 ENCOUNTER — Ambulatory Visit (INDEPENDENT_AMBULATORY_CARE_PROVIDER_SITE_OTHER): Payer: Medicaid Other | Admitting: Certified Nurse Midwife

## 2018-04-30 VITALS — BP 116/69 | HR 99 | Wt 147.1 lb

## 2018-04-30 DIAGNOSIS — Z3493 Encounter for supervision of normal pregnancy, unspecified, third trimester: Secondary | ICD-10-CM

## 2018-04-30 NOTE — Patient Instructions (Signed)
Braxton Hicks Contractions Contractions of the uterus can occur throughout pregnancy, but they are not always a sign that you are in labor. You may have practice contractions called Braxton Hicks contractions. These false labor contractions are sometimes confused with true labor. What are Braxton Hicks contractions? Braxton Hicks contractions are tightening movements that occur in the muscles of the uterus before labor. Unlike true labor contractions, these contractions do not result in opening (dilation) and thinning of the cervix. Toward the end of pregnancy (32-34 weeks), Braxton Hicks contractions can happen more often and may become stronger. These contractions are sometimes difficult to tell apart from true labor because they can be very uncomfortable. You should not feel embarrassed if you go to the hospital with false labor. Sometimes, the only way to tell if you are in true labor is for your health care provider to look for changes in the cervix. The health care provider will do a physical exam and may monitor your contractions. If you are not in true labor, the exam should show that your cervix is not dilating and your water has not broken. If there are no other health problems associated with your pregnancy, it is completely safe for you to be sent home with false labor. You may continue to have Braxton Hicks contractions until you go into true labor. How to tell the difference between true labor and false labor True labor  Contractions last 30-70 seconds.  Contractions become very regular.  Discomfort is usually felt in the top of the uterus, and it spreads to the lower abdomen and low back.  Contractions do not go away with walking.  Contractions usually become more intense and increase in frequency.  The cervix dilates and gets thinner. False labor  Contractions are usually shorter and not as strong as true labor contractions.  Contractions are usually irregular.  Contractions  are often felt in the front of the lower abdomen and in the groin.  Contractions may go away when you walk around or change positions while lying down.  Contractions get weaker and are shorter-lasting as time goes on.  The cervix usually does not dilate or become thin. Follow these instructions at home:   Take over-the-counter and prescription medicines only as told by your health care provider.  Keep up with your usual exercises and follow other instructions from your health care provider.  Eat and drink lightly if you think you are going into labor.  If Braxton Hicks contractions are making you uncomfortable: ? Change your position from lying down or resting to walking, or change from walking to resting. ? Sit and rest in a tub of warm water. ? Drink enough fluid to keep your urine pale yellow. Dehydration may cause these contractions. ? Do slow and deep breathing several times an hour.  Keep all follow-up prenatal visits as told by your health care provider. This is important. Contact a health care provider if:  You have a fever.  You have continuous pain in your abdomen. Get help right away if:  Your contractions become stronger, more regular, and closer together.  You have fluid leaking or gushing from your vagina.  You pass blood-tinged mucus (bloody show).  You have bleeding from your vagina.  You have low back pain that you never had before.  You feel your baby's head pushing down and causing pelvic pressure.  Your baby is not moving inside you as much as it used to. Summary  Contractions that occur before labor are   called Braxton Hicks contractions, false labor, or practice contractions.  Braxton Hicks contractions are usually shorter, weaker, farther apart, and less regular than true labor contractions. True labor contractions usually become progressively stronger and regular, and they become more frequent.  Manage discomfort from Braxton Hicks contractions  by changing position, resting in a warm bath, drinking plenty of water, or practicing deep breathing. This information is not intended to replace advice given to you by your health care provider. Make sure you discuss any questions you have with your health care provider. Document Released: 08/17/2016 Document Revised: 01/16/2017 Document Reviewed: 08/17/2016 Elsevier Interactive Patient Education  2019 [employer]vier Inc[employer]  

## 2018-04-30 NOTE — Progress Notes (Signed)
ROB doing well. Feels good movement. Having irregular contractions. SVE per pt request no change in exam from last week. Sample PNV given. Follow up 1 wk.   Doreene Burke, CNM

## 2018-05-08 ENCOUNTER — Encounter: Payer: Medicaid Other | Admitting: Obstetrics and Gynecology

## 2018-05-09 ENCOUNTER — Ambulatory Visit (INDEPENDENT_AMBULATORY_CARE_PROVIDER_SITE_OTHER): Payer: Medicaid Other | Admitting: Obstetrics and Gynecology

## 2018-05-09 VITALS — BP 116/81 | HR 98 | Wt 148.1 lb

## 2018-05-09 DIAGNOSIS — Z3493 Encounter for supervision of normal pregnancy, unspecified, third trimester: Secondary | ICD-10-CM

## 2018-05-09 LAB — POCT URINALYSIS DIPSTICK OB
Bilirubin, UA: NEGATIVE
Glucose, UA: NEGATIVE
Ketones, UA: NEGATIVE
Leukocytes, UA: NEGATIVE
Nitrite, UA: NEGATIVE
Spec Grav, UA: 1.015 (ref 1.010–1.025)
Urobilinogen, UA: 0.2 E.U./dL
pH, UA: 7.5 (ref 5.0–8.0)

## 2018-05-09 NOTE — Progress Notes (Signed)
ROB- pt is having pelvic pressure, lower abd cramping 

## 2018-05-09 NOTE — Progress Notes (Signed)
ROB-doing well will paln u/s for EFW and AFI next week, delivered first child at 40 weeks. Discussed flu restrictions.

## 2018-05-10 ENCOUNTER — Encounter: Payer: Self-pay | Admitting: Certified Nurse Midwife

## 2018-05-14 ENCOUNTER — Encounter: Payer: Medicaid Other | Admitting: Certified Nurse Midwife

## 2018-05-14 ENCOUNTER — Other Ambulatory Visit: Payer: Medicaid Other

## 2018-05-15 ENCOUNTER — Other Ambulatory Visit: Payer: Self-pay | Admitting: Obstetrics and Gynecology

## 2018-05-15 ENCOUNTER — Telehealth: Payer: Self-pay | Admitting: Certified Nurse Midwife

## 2018-05-15 DIAGNOSIS — Z3689 Encounter for other specified antenatal screening: Secondary | ICD-10-CM

## 2018-05-15 DIAGNOSIS — O288 Other abnormal findings on antenatal screening of mother: Secondary | ICD-10-CM

## 2018-05-15 DIAGNOSIS — Z3493 Encounter for supervision of normal pregnancy, unspecified, third trimester: Secondary | ICD-10-CM

## 2018-05-15 NOTE — Telephone Encounter (Signed)
Kim from [place] called and stated that since the patient was a new patient to them, that they would need to change the order for this patient to US OB greater than 14wks, and was verbally approved by [facility], and Kim stated she would change the order and send to [facility] inbox for her to sign off on it, please advise, thanks.

## 2018-05-15 NOTE — Telephone Encounter (Signed)
The patient called and stated that she would like to speak with a nurse to ask a few questions and clarify what she may need to do just in case she is induced tomorrow on her due date. No other information was disclosed, Pt prefers to receive a call back sometime today if possible. Please advise.

## 2018-05-16 ENCOUNTER — Ambulatory Visit: Admission: RE | Admit: 2018-05-16 | Payer: Medicaid Other | Source: Ambulatory Visit

## 2018-05-16 ENCOUNTER — Inpatient Hospital Stay: Payer: Medicaid Other | Admitting: Anesthesiology

## 2018-05-16 ENCOUNTER — Ambulatory Visit (INDEPENDENT_AMBULATORY_CARE_PROVIDER_SITE_OTHER): Payer: Medicaid Other | Admitting: Obstetrics and Gynecology

## 2018-05-16 ENCOUNTER — Other Ambulatory Visit: Payer: Self-pay | Admitting: Certified Nurse Midwife

## 2018-05-16 ENCOUNTER — Other Ambulatory Visit: Payer: Self-pay

## 2018-05-16 ENCOUNTER — Inpatient Hospital Stay: Admit: 2018-05-16 | Payer: Self-pay

## 2018-05-16 ENCOUNTER — Telehealth: Payer: Self-pay | Admitting: *Deleted

## 2018-05-16 ENCOUNTER — Telehealth: Payer: Self-pay

## 2018-05-16 ENCOUNTER — Inpatient Hospital Stay
Admission: EM | Admit: 2018-05-16 | Discharge: 2018-05-18 | DRG: 806 | Disposition: A | Discharge status: Home / Self Care | Payer: Medicaid Other | Attending: Certified Nurse Midwife | Admitting: Certified Nurse Midwife

## 2018-05-16 DIAGNOSIS — Z3A40 40 weeks gestation of pregnancy: Secondary | ICD-10-CM

## 2018-05-16 DIAGNOSIS — O480 Post-term pregnancy: Secondary | ICD-10-CM | POA: Diagnosis not present

## 2018-05-16 DIAGNOSIS — O4290 Premature rupture of membranes, unspecified as to length of time between rupture and onset of labor, unspecified weeks of gestation: Secondary | ICD-10-CM

## 2018-05-16 DIAGNOSIS — O4292 Full-term premature rupture of membranes, unspecified as to length of time between rupture and onset of labor: Principal | ICD-10-CM | POA: Diagnosis present

## 2018-05-16 DIAGNOSIS — F1291 Cannabis use, unspecified, in remission: Secondary | ICD-10-CM

## 2018-05-16 DIAGNOSIS — Z3483 Encounter for supervision of other normal pregnancy, third trimester: Secondary | ICD-10-CM | POA: Diagnosis present

## 2018-05-16 DIAGNOSIS — O4202 Full-term premature rupture of membranes, onset of labor within 24 hours of rupture: Secondary | ICD-10-CM

## 2018-05-16 DIAGNOSIS — Z87898 Personal history of other specified conditions: Secondary | ICD-10-CM

## 2018-05-16 LAB — CBC
HCT: 33.4 % — ABNORMAL LOW (ref 36.0–46.0)
Hemoglobin: 11.1 g/dL — ABNORMAL LOW (ref 12.0–15.0)
MCH: 33 pg (ref 26.0–34.0)
MCHC: 33.2 g/dL (ref 30.0–36.0)
MCV: 99.4 fL (ref 80.0–100.0)
Platelets: 292 10*3/uL (ref 150–400)
RBC: 3.36 MIL/uL — ABNORMAL LOW (ref 3.87–5.11)
RDW: 15.4 % (ref 11.5–15.5)
WBC: 9.3 10*3/uL (ref 4.0–10.5)
nRBC: 0 % (ref 0.0–0.2)

## 2018-05-16 LAB — URINE DRUG SCREEN, QUALITATIVE (ARMC ONLY)
Amphetamines, Ur Screen: NOT DETECTED
Barbiturates, Ur Screen: NOT DETECTED
Benzodiazepine, Ur Scrn: NOT DETECTED
Cannabinoid 50 Ng, Ur ~~LOC~~: NOT DETECTED
Cocaine Metabolite,Ur ~~LOC~~: NOT DETECTED
MDMA (ECSTASY) UR SCREEN: NOT DETECTED
Methadone Scn, Ur: NOT DETECTED
Opiate, Ur Screen: NOT DETECTED
Phencyclidine (PCP) Ur S: NOT DETECTED
Tricyclic, Ur Screen: NOT DETECTED

## 2018-05-16 LAB — TYPE AND SCREEN
ABO/RH(D): A POS
Antibody Screen: NEGATIVE

## 2018-05-16 MED ORDER — OXYTOCIN 10 UNIT/ML IJ SOLN
INTRAMUSCULAR | Status: AC
  Filled 2018-05-16: qty 2

## 2018-05-16 MED ORDER — OXYTOCIN BOLUS FROM INFUSION
500.0000 mL | Freq: Once | INTRAVENOUS | Status: DC
  Administered 2018-05-17: 500 mL via INTRAVENOUS

## 2018-05-16 MED ORDER — AMMONIA AROMATIC IN INHA
RESPIRATORY_TRACT | Status: AC
  Filled 2018-05-16: qty 10

## 2018-05-16 MED ORDER — LACTATED RINGERS IV SOLN: 500.0000 mL | INTRAVENOUS | Status: DC | PRN

## 2018-05-16 MED ORDER — OXYTOCIN 40 UNITS IN NORMAL SALINE INFUSION - SIMPLE MED
1.0000 m[IU]/min | INTRAVENOUS | Status: DC
  Administered 2018-05-16: 2 m[IU]/min via INTRAVENOUS

## 2018-05-16 MED ORDER — FENTANYL 2.5 MCG/ML W/ROPIVACAINE 0.15% IN NS 100 ML EPIDURAL (ARMC)
EPIDURAL | Status: AC
  Administered 2018-05-17: 12 mL/h via EPIDURAL
  Filled 2018-05-16: qty 100

## 2018-05-16 MED ORDER — OXYTOCIN 40 UNITS IN NORMAL SALINE INFUSION - SIMPLE MED
INTRAVENOUS | Status: AC
  Filled 2018-05-16: qty 1000

## 2018-05-16 MED ORDER — MISOPROSTOL 200 MCG PO TABS
ORAL_TABLET | ORAL | Status: AC
  Administered 2018-05-17: 800 ug
  Filled 2018-05-16: qty 4

## 2018-05-16 MED ORDER — ACETAMINOPHEN 325 MG PO TABS: 650.0000 mg | ORAL_TABLET | ORAL | Status: DC | PRN

## 2018-05-16 MED ORDER — MISOPROSTOL 50MCG HALF TABLET
50.0000 ug | ORAL_TABLET | ORAL | Status: DC
  Administered 2018-05-16: 50 ug via VAGINAL
  Filled 2018-05-16: qty 1

## 2018-05-16 MED ORDER — SOD CITRATE-CITRIC ACID 500-334 MG/5ML PO SOLN: 30.0000 mL | ORAL | Status: DC | PRN

## 2018-05-16 MED ORDER — LIDOCAINE HCL (PF) 1 % IJ SOLN: 30.0000 mL | INTRAMUSCULAR | Status: DC | PRN

## 2018-05-16 MED ORDER — TERBUTALINE SULFATE 1 MG/ML IJ SOLN: 0.2500 mg | Freq: Once | INTRAMUSCULAR | Status: DC | PRN

## 2018-05-16 MED ORDER — OXYTOCIN 40 UNITS IN NORMAL SALINE INFUSION - SIMPLE MED: 2.5000 [IU]/h | INTRAVENOUS | Status: DC

## 2018-05-16 MED ORDER — ONDANSETRON HCL 4 MG/2ML IJ SOLN: 4.0000 mg | Freq: Four times a day (QID) | INTRAMUSCULAR | Status: DC | PRN

## 2018-05-16 MED ORDER — BUTORPHANOL TARTRATE 2 MG/ML IJ SOLN: 1.0000 mg | INTRAMUSCULAR | Status: DC | PRN

## 2018-05-16 MED ORDER — LACTATED RINGERS IV SOLN
INTRAVENOUS | Status: DC
  Administered 2018-05-16 (×2): via INTRAVENOUS

## 2018-05-16 MED ORDER — OXYTOCIN 10 UNIT/ML IJ SOLN: 10.0000 [IU] | Freq: Once | INTRAMUSCULAR | Status: DC

## 2018-05-16 MED ORDER — LIDOCAINE HCL (PF) 1 % IJ SOLN
INTRAMUSCULAR | Status: AC
  Filled 2018-05-16: qty 30

## 2018-05-16 NOTE — Progress Notes (Signed)
LABOR NOTE   Mercedes Potter 27 y.o.@ at [redacted]w[redacted]d Early latent labor.  SUBJECTIVE:  Pt feeling contactions OBJECTIVE:  Temp 98.3 F (36.8 C) (Oral)   Resp 16   LMP 07/31/2017  No intake/output data recorded.  She has shown cervical change. CERVIX: 3 cm:  70%:   -2:   mid position:   firm SVE:   Dilation: 3 Effacement (%): 70 Station: -2 Exam by:: A Glen Kesinger RN CONTRACTIONS: regular, every 1-3 minutes FHR: Fetal heart tracing reviewed. Baseline: 150 bpm, Variability: Good {> 6 bpm), Accelerations: Reactive and Decelerations: Early Category I   Analgesia: Labor support without medications  Labs: Lab Results  Component Value Date   WBC 9.3 05/16/2018   HGB 11.1 (L) 05/16/2018   HCT 33.4 (L) 05/16/2018   MCV 99.4 05/16/2018   PLT 292 05/16/2018    ASSESSMENT: 1) Labor curve reviewed.       Progress: Early latent labor.     Membranes: ruptured, meconium light , fore bag ruptured          Active Problems:   Labor and delivery, indication for care   PLAN: continue present management Discussed use of pitocin if needed. Pt and family verbalizes and agree to plan.  Doreene Burke, CNM 05/16/2018 6:37 PM

## 2018-05-16 NOTE — Telephone Encounter (Signed)
As per ATs request- voicemail message was left for pt to please return my call ASAP regarding induction of labor. Mychart message also sent. Home phone # listed was called and a Spanish speaking woman stated "

## 2018-05-16 NOTE — H&P (Signed)
History and Physical   HPI  Mercedes Potter is a 28 y.o. G4P0011 at [redacted]w[redacted]d Estimated Date of Delivery: 05/16/18 who is being admitted for SROM and augmentation labor    OB History  OB History  Gravida Para Term Preterm AB Living  4 0 0 0 1 1  SAB TAB Ectopic Multiple Live Births  1 0 0 0 1    # Outcome Date GA Lbr Len/2nd Weight Sex Delivery Anes PTL Lv  4 Current           3 SAB           2 Gravida     M Vag-Spont   LIV  1 Gravida             PROBLEM LIST  Pregnancy complications or risks: Patient Active Problem List   Diagnosis Date Noted  . Labor and delivery, indication for care 05/16/2018  . Low lying placenta nos or without hemorrhage, third trimester 02/19/2018  . History of marijuana use 10/26/2017  . Pregnancy with history of ectopic pregnancy, first trimester 10/11/2017  . Current pregnancy in first trimester with history of spontaneous abortion during prior pregnancy 10/11/2017    Prenatal labs and studies: ABO, Rh: A/Positive/-- (07/08 1431) Antibody:   Rubella: 22.30 (07/08 1431) RPR: Non Reactive (12/05 0932)  HBsAg:    HIV: Non Reactive (07/08 1431)  ION:GEXBMWUX (12/31 1636)   Past Medical History:  Diagnosis Date  . Anemia      Past Surgical History:  Procedure Laterality Date  . INNER EAR SURGERY Right      Medications    Current Discharge Medication List    CONTINUE these medications which have NOT CHANGED   Details  Doxylamine-Pyridoxine ER (BONJESTA) 20-20 MG TBCR Take 1 capsule by mouth 2 (two) times daily. Qty: 62 tablet, Refills: 4    ferrous sulfate 325 (65 FE) MG tablet Take 325 mg by mouth daily with breakfast.    Prenatal Vit-Fe Phos-FA-Omega (VITAFOL GUMMIES) 3.33-0.333-34.8 MG CHEW Chew 3 Pieces by mouth daily. Qty: 90 tablet, Refills: 4         Allergies  Latex and Nickel  Review of Systems  Constitutional: negative Eyes: negative Ears, nose, mouth, throat, and face: negative Respiratory:  negative Cardiovascular: negative Gastrointestinal: negative Genitourinary:negative Integument/breast: negative Hematologic/lymphatic: negative Musculoskeletal:negative Neurological: negative Behavioral/Psych: negative Endocrine: negative Allergic/Immunologic: negative  Physical Exam  LMP 07/31/2017   Lungs:  CTA B Cardio: RRR  Abd: Soft, gravid, NT Presentation: cephalic EXT: No C/C/ 1+ Edema DTRs: 2+ B CERVIX: 1.5 cm/60/-2   See Prenatal records for more detailed PE.     FHR:   Baseline 130, moderate variability, accelerations present, no decels Toco: Uterine Contractions: irregular, irritability     Test Results  No results found for this or any previous visit (from the past 24 hour(s)). Group B Strep negative  Assessment   G4P0011 at [redacted]w[redacted]d Estimated Date of Delivery: 05/16/18  The fetus is reassuring.    Patient Active Problem List   Diagnosis Date Noted  . Labor and delivery, indication for care 05/16/2018  . Low lying placenta nos or without hemorrhage, third trimester 02/19/2018  . History of marijuana use 10/26/2017  . Pregnancy with history of ectopic pregnancy, first trimester 10/11/2017  . Current pregnancy in first trimester with history of spontaneous abortion during prior pregnancy 10/11/2017    Plan  1. Admit to L&D :   cytotec augmentation  2. EFM:- Category 1 3. Epidural if desired.  Stadol for IV pain until epidural requested. 4. Admission labs    Doreene Burke, [place] 05/16/2018 2:02 PM

## 2018-05-16 NOTE — Anesthesia Preprocedure Evaluation (Signed)
Anesthesia Evaluation  Patient identified by MRN, date of birth, ID band Patient awake    Reviewed: Allergy & Precautions, H&P , NPO status , Patient's Chart, lab work & pertinent test results  Airway Mallampati: III       Dental  (+) Teeth Intact   Pulmonary Current Smoker,           Cardiovascular Exercise Tolerance: Good negative cardio ROS       Neuro/Psych    GI/Hepatic negative GI ROS,   Endo/Other    Renal/GU   negative genitourinary   Musculoskeletal   Abdominal   Peds  Hematology  (+) Blood dyscrasia, anemia ,   Anesthesia Other Findings Past Medical History: No date: Anemia  Past Surgical History: No date: INNER EAR SURGERY; Right  BMI    Body Mass Index:  25.42 kg/m      Reproductive/Obstetrics (+) Pregnancy                             Anesthesia Physical Anesthesia Plan  ASA: III  Anesthesia Plan: Epidural   Post-op Pain Management:    Induction:   PONV Risk Score and Plan:   Airway Management Planned:   Additional Equipment:   Intra-op Plan:   Post-operative Plan:   Informed Consent: I have reviewed the patients History and Physical, chart, labs and discussed the procedure including the risks, benefits and alternatives for the proposed anesthesia with the patient or authorized representative who has indicated his/her understanding and acceptance.       Plan Discussed with: Anesthesiologist  Anesthesia Plan Comments:         Anesthesia Quick Evaluation

## 2018-05-16 NOTE — Progress Notes (Signed)
Here for leaking fluid and labor eval- NTZ? And fern +, no fluid noted at cervix with membranes palpated. Sent to L&D for delivery.

## 2018-05-16 NOTE — Telephone Encounter (Signed)
"   no Nyeema here". Computer kicked me out unable to finish previous note.

## 2018-05-17 ENCOUNTER — Encounter: Payer: Medicaid Other | Admitting: Certified Nurse Midwife

## 2018-05-17 LAB — CBC
HCT: 30.7 % — ABNORMAL LOW (ref 36.0–46.0)
Hemoglobin: 10.3 g/dL — ABNORMAL LOW (ref 12.0–15.0)
MCH: 32.9 pg (ref 26.0–34.0)
MCHC: 33.6 g/dL (ref 30.0–36.0)
MCV: 98.1 fL (ref 80.0–100.0)
PLATELETS: 227 10*3/uL (ref 150–400)
RBC: 3.13 MIL/uL — AB (ref 3.87–5.11)
RDW: 15.5 % (ref 11.5–15.5)
WBC: 18.4 10*3/uL — ABNORMAL HIGH (ref 4.0–10.5)
nRBC: 0 % (ref 0.0–0.2)

## 2018-05-17 LAB — HEPATITIS B SURFACE ANTIGEN: Hepatitis B Surface Ag: NEGATIVE

## 2018-05-17 LAB — RPR: RPR Ser Ql: NONREACTIVE

## 2018-05-17 MED ORDER — FERROUS SULFATE 325 (65 FE) MG PO TABS
325.0000 mg | ORAL_TABLET | Freq: Every day | ORAL | Status: DC
  Administered 2018-05-18: 325 mg via ORAL
  Filled 2018-05-17: qty 1

## 2018-05-17 MED ORDER — OXYTOCIN 40 UNITS IN NORMAL SALINE INFUSION - SIMPLE MED
2.5000 [IU]/h | INTRAVENOUS | Status: DC | PRN
  Filled 2018-05-17: qty 1000

## 2018-05-17 MED ORDER — SODIUM CHLORIDE 0.9 % IV SOLN
INTRAVENOUS | Status: DC | PRN
  Administered 2018-05-16 (×3): 5 mL via EPIDURAL

## 2018-05-17 MED ORDER — BENZOCAINE-MENTHOL 20-0.5 % EX AERO
1.0000 "application " | INHALATION_SPRAY | CUTANEOUS | Status: DC | PRN
  Administered 2018-05-18: 1 via TOPICAL
  Filled 2018-05-17: qty 56

## 2018-05-17 MED ORDER — PHENYLEPHRINE 40 MCG/ML (10ML) SYRINGE FOR IV PUSH (FOR BLOOD PRESSURE SUPPORT): 80.0000 ug | PREFILLED_SYRINGE | INTRAVENOUS | Status: DC | PRN

## 2018-05-17 MED ORDER — DIPHENHYDRAMINE HCL 50 MG/ML IJ SOLN: 12.5000 mg | INTRAMUSCULAR | Status: DC | PRN

## 2018-05-17 MED ORDER — IBUPROFEN 600 MG PO TABS
600.0000 mg | ORAL_TABLET | Freq: Four times a day (QID) | ORAL | Status: DC
  Administered 2018-05-17 – 2018-05-18 (×4): 600 mg via ORAL
  Filled 2018-05-17 (×4): qty 1

## 2018-05-17 MED ORDER — METHYLERGONOVINE MALEATE 0.2 MG/ML IJ SOLN: 0.2000 mg | INTRAMUSCULAR | Status: DC | PRN

## 2018-05-17 MED ORDER — LACTATED RINGERS IV SOLN: 500.0000 mL | Freq: Once | INTRAVENOUS | Status: DC

## 2018-05-17 MED ORDER — SODIUM CHLORIDE 0.9 % IV SOLN: 1.0000 g | INTRAVENOUS | Status: DC

## 2018-05-17 MED ORDER — OXYCODONE-ACETAMINOPHEN 5-325 MG PO TABS: 1.0000 | ORAL_TABLET | ORAL | Status: DC | PRN

## 2018-05-17 MED ORDER — SODIUM CHLORIDE 0.9 % IV SOLN
2.0000 g | Freq: Once | INTRAVENOUS | Status: AC
  Administered 2018-05-17: 2 g via INTRAVENOUS
  Filled 2018-05-17: qty 2000

## 2018-05-17 MED ORDER — SIMETHICONE 80 MG PO CHEW: 80.0000 mg | CHEWABLE_TABLET | ORAL | Status: DC | PRN

## 2018-05-17 MED ORDER — OXYCODONE-ACETAMINOPHEN 5-325 MG PO TABS: 2.0000 | ORAL_TABLET | ORAL | Status: DC | PRN

## 2018-05-17 MED ORDER — EPHEDRINE 5 MG/ML INJ: 10.0000 mg | INTRAVENOUS | Status: DC | PRN

## 2018-05-17 MED ORDER — ONDANSETRON HCL 4 MG/2ML IJ SOLN: 4.0000 mg | INTRAMUSCULAR | Status: DC | PRN

## 2018-05-17 MED ORDER — METHYLERGONOVINE MALEATE 0.2 MG PO TABS: 0.2000 mg | ORAL_TABLET | ORAL | Status: DC | PRN

## 2018-05-17 MED ORDER — SENNOSIDES-DOCUSATE SODIUM 8.6-50 MG PO TABS: 2.0000 | ORAL_TABLET | ORAL | Status: DC

## 2018-05-17 MED ORDER — METHYLERGONOVINE MALEATE 0.2 MG/ML IJ SOLN
INTRAMUSCULAR | Status: AC
  Administered 2018-05-17: 0.2 mg
  Filled 2018-05-17: qty 1

## 2018-05-17 MED ORDER — ONDANSETRON HCL 4 MG/2ML IJ SOLN
INTRAMUSCULAR | Status: AC
  Filled 2018-05-17: qty 2

## 2018-05-17 MED ORDER — DIBUCAINE 1 % RE OINT
1.0000 "application " | TOPICAL_OINTMENT | RECTAL | Status: DC | PRN
  Administered 2018-05-18: 1 via RECTAL
  Filled 2018-05-17: qty 28

## 2018-05-17 MED ORDER — CARBOPROST TROMETHAMINE 250 MCG/ML IM SOLN
INTRAMUSCULAR | Status: AC
  Filled 2018-05-17: qty 1

## 2018-05-17 MED ORDER — ACETAMINOPHEN 325 MG PO TABS: 650.0000 mg | ORAL_TABLET | ORAL | Status: DC | PRN

## 2018-05-17 MED ORDER — COCONUT OIL OIL
1.0000 "application " | TOPICAL_OIL | Status: DC | PRN
  Filled 2018-05-17: qty 120

## 2018-05-17 MED ORDER — DOCUSATE SODIUM 100 MG PO CAPS
100.0000 mg | ORAL_CAPSULE | Freq: Two times a day (BID) | ORAL | Status: DC
  Administered 2018-05-18: 100 mg via ORAL
  Filled 2018-05-17: qty 1

## 2018-05-17 MED ORDER — FENTANYL 2.5 MCG/ML W/ROPIVACAINE 0.15% IN NS 100 ML EPIDURAL (ARMC)
12.0000 mL/h | EPIDURAL | Status: DC
  Administered 2018-05-17: 12 mL/h via EPIDURAL

## 2018-05-17 MED ORDER — ONDANSETRON HCL 4 MG PO TABS: 4.0000 mg | ORAL_TABLET | ORAL | Status: DC | PRN

## 2018-05-17 MED ORDER — WITCH HAZEL-GLYCERIN EX PADS
1.0000 "application " | MEDICATED_PAD | CUTANEOUS | Status: DC | PRN
  Administered 2018-05-18: 1 via TOPICAL
  Filled 2018-05-17: qty 100

## 2018-05-17 MED ORDER — FENTANYL 2.5 MCG/ML W/ROPIVACAINE 0.15% IN NS 100 ML EPIDURAL (ARMC)
EPIDURAL | Status: DC | PRN
  Administered 2018-05-16: 12 mL/h via EPIDURAL

## 2018-05-17 MED ORDER — OXYTOCIN 40 UNITS IN NORMAL SALINE INFUSION - SIMPLE MED
INTRAVENOUS | Status: AC
  Filled 2018-05-17: qty 1000

## 2018-05-17 MED ORDER — LIDOCAINE HCL (PF) 1 % IJ SOLN
INTRAMUSCULAR | Status: DC | PRN
  Administered 2018-05-16: 1 mL

## 2018-05-17 MED ORDER — TETANUS-DIPHTH-ACELL PERTUSSIS 5-2.5-18.5 LF-MCG/0.5 IM SUSP
0.5000 mL | Freq: Once | INTRAMUSCULAR | Status: AC
  Administered 2018-05-18: 0.5 mL via INTRAMUSCULAR
  Filled 2018-05-17: qty 0.5

## 2018-05-17 MED ORDER — PRENATAL MULTIVITAMIN CH
1.0000 | ORAL_TABLET | Freq: Every day | ORAL | Status: DC
  Administered 2018-05-17 – 2018-05-18 (×2): 1 via ORAL
  Filled 2018-05-17 (×2): qty 1

## 2018-05-17 NOTE — Anesthesia Procedure Notes (Signed)
Epidural Patient location during procedure: OB Start time: 05/17/2018 11:36 PM End time: 05/17/2018 11:54 PM  Staffing Anesthesiologist: Jovita GammaFitzgerald, Kathryn L, MD Performed: anesthesiologist   Preanesthetic Checklist Completed: patient identified, site marked, surgical consent, pre-op evaluation, timeout performed, IV checked, risks and benefits discussed and monitors and equipment checked  Epidural Patient position: sitting Prep: ChloraPrep Patient monitoring: heart rate, continuous pulse ox and blood pressure Approach: midline Location: L4-L5 Injection technique: LOR saline  Needle:  Needle type: Tuohy  Needle gauge: 18 G Needle length: 9 cm and 9 Needle insertion depth: 6 cm Catheter type: closed end flexible Catheter size: 20 Guage Catheter at skin depth: 9 cm Test dose: negative and Other  Assessment Events: blood not aspirated, injection not painful, no injection resistance, negative IV test and no paresthesia  Additional Notes   Patient tolerated the insertion well without complications.Reason for block:procedure for pain

## 2018-05-17 NOTE — Progress Notes (Signed)
LABOR NOTE   Mercedes Potter 27 y.o.@ at [redacted]w[redacted]d Prolonged latent labor.  SUBJECTIVE:  Comfortable with epidural OBJECTIVE:  BP 130/86   Pulse 99   Temp 97.7 F (36.5 C) (Oral)   Resp 18   Ht 5\' 4"  (1.626 m)   Wt 67.2 kg   LMP 07/31/2017   SpO2 99%   BMI 25.42 kg/m  No intake/output data recorded.  She has shown cervical change. CERVIX: per RN exam SVE:   3/90/-2 CONTRACTIONS: regular, every 2 minutes FHR: Fetal heart tracing reviewed. Baseline: 150 bpm, Variability: Good {> 6 bpm), Accelerations: Non-reactive but appropriate for gestational age and Decelerations: Early Category II   Analgesia: Epidural  Labs: Lab Results  Component Value Date   WBC 9.3 05/16/2018   HGB 11.1 (L) 05/16/2018   HCT 33.4 (L) 05/16/2018   MCV 99.4 05/16/2018   PLT 292 05/16/2018    ASSESSMENT: 1) Labor curve reviewed.       Progress: Prolonged latent labor.     Membranes: ruptured, meconium light        Active Problems:   Labor and delivery, indication for care   PLAN: continue present management and IV Pitocin augmentation   Doreene Burke 05/17/2018 12:48 AM

## 2018-05-17 NOTE — Lactation Note (Signed)
This note was copied from a baby's chart. Lactation Consultation Note  Patient Name: Girl Nereyda Westermeyer QBHAL'P Date: 05/17/2018 Reason for consult: Initial assessment Baby has been gaggy, spitting mucous and not interested in feeding, now showing cues  Maternal Data Formula Feeding for Exclusion: No Does the patient have breastfeeding experience prior to this delivery?: No Mom has a 28 yr old son who she did not breastfeed Feeding Feeding Type: Breast Fed  LATCH Score Latch: Grasps breast easily, tongue down, lips flanged, rhythmical sucking.  Audible Swallowing: A few with stimulation  Type of Nipple: Everted at rest and after stimulation  Comfort (Breast/Nipple): Soft / non-tender  Hold (Positioning): Assistance needed to correctly position infant at breast and maintain latch.  LATCH Score: 8  Interventions Interventions: Breast feeding basics reviewed;Assisted with latch;Skin to skin;Breast compression;Adjust position;Support pillows;Position options  Lactation Tools Discussed/Used     Consult Status Consult Status: Follow-up Date: 05/18/18 Follow-up type: In-patient    Dyann Kief 05/17/2018, 4:19 PM

## 2018-05-18 MED ORDER — MEDROXYPROGESTERONE ACETATE 150 MG/ML IM SUSP: 150.0000 mg | INTRAMUSCULAR | 4 refills | Status: DC

## 2018-05-18 MED ORDER — VITAMIN D3 125 MCG (5000 UT) PO CAPS: 1.0000 | ORAL_CAPSULE | Freq: Every day | ORAL | 2 refills | Status: DC

## 2018-05-18 MED ORDER — IBUPROFEN 600 MG PO TABS: 600.0000 mg | ORAL_TABLET | Freq: Four times a day (QID) | ORAL | 0 refills | Status: DC

## 2018-05-18 NOTE — Lactation Note (Signed)
This note was copied from a baby's chart. Lactation Consultation Note  Patient Name: Mercedes Potter Today's Date: 05/18/2018 Reason for consult: Follow-up assessment;1st time breastfeeding;Term  Assisted mom with comfortable position with pillow support in football hold.  Good latch observed with strong rhythmic sucking and occasional swallows.  Mom kept letting her slip to tip of nipple.  Demonstrated and stressed importance of deep latch.  Mom has a small broken area in center of left nipple which she verbalizes as being tender.  Mom already has coconut oil and comfort gels and been instructed in use.  Demonstrated hand expression of colostrum/breast milk on nipples after breast feeding to prevent bacteria, lubrication, healing and discomfort.  FOB asking about pumping.  Discouraged pumping too early.  Encouraged to wait until mature milk is in and breast feeding is well established.  DEBP kit given and instructed in manual (single and double) and electric use.  Instructed in pumping, collection, storage, labeling and handling of breast milk.  Reviewed supply and demand, normal course of lactation and routine newborn feeding patterns.  Stressed importance of breast feeding frequently while mature milk is transitioning to prevent engorgement and ensure a plentiful milk supply.  Mom and baby to be discharged today.  Discussed community lactation resources available after discharge and encouraged to call with any questions, concerns or assistance.   Maternal Data Formula Feeding for Exclusion: No Reason for exclusion: Mother's choice to formula and breast feed on admission Has patient been taught Hand Expression?: Yes Does the patient have breastfeeding experience prior to this delivery?: No  Feeding Feeding Type: Breast Fed  LATCH Score Latch: Grasps breast easily, tongue down, lips flanged, rhythmical sucking.  Audible Swallowing: A few with stimulation  Type of Nipple: Everted at rest and  after stimulation  Comfort (Breast/Nipple): Filling, red/small blisters or bruises, mild/mod discomfort  Hold (Positioning): Assistance needed to correctly position infant at breast and maintain latch.  LATCH Score: 7  Interventions Interventions: Breast feeding basics reviewed;Assisted with latch;Skin to skin;Breast massage;Breast compression;Support pillows;Position options;Coconut oil;Comfort gels;Hand pump;DEBP  Lactation Tools Discussed/Used Tools: Pump;Coconut oil;Comfort gels Breast pump type: Double-Electric Breast Pump;Manual WIC Program: Yes Pump Review: Setup, frequency, and cleaning;Milk Storage;Other (comment) Initiated by:: S.Willliams,RN,BSN,IBCLC Date initiated:: 05/18/18   Consult Status Consult Status: PRN Follow-up type: Call as needed    ,  Kay 05/18/2018, 11:24 AM    

## 2018-05-18 NOTE — Discharge Summary (Signed)
Physician Obstetric Discharge Summary  Patient ID: Mercedes Potter MRN: [id number] DOB/AGE: 08/12/1990 27 y.o.   Date of Admission: 05/16/2018  Date of Discharge:   Admitting Diagnosis: Onset of Labor at [redacted]w[redacted]d  Secondary Diagnosis: LLP  Mode of Delivery: normal spontaneous vaginal delivery     Discharge Diagnosis: SVD, PPH   Intrapartum Procedures: epidural and pitocin augmentation   Post partum procedures: none  Complications: none   Brief Hospital Course  Mercedes Potter is a [id number] who had a SVD on 05/17/2018;  for further details of this, please refer to the delivey note.  Patient had an uncomplicated postpartum course.  By time of discharge on PPD#1, her pain was controlled on oral pain medications; she had appropriate lochia and was ambulating, voiding without difficulty and tolerating regular diet.  She was deemed stable for discharge to home.    Labs: CBC Latest Ref Rng & Units 05/17/2018 05/16/2018 03/21/2018  WBC 4.0 - 10.5 K/uL 18.4(H) 9.3 10.3  Hemoglobin 12.0 - 15.0 g/dL 10.3(L) 11.1(L) 9.8(L)  Hematocrit 36.0 - 46.0 % 30.7(L) 33.4(L) 28.4(L)  Platelets 150 - 400 K/uL 227 292 350   A POS  Physical exam:  Blood pressure (!) 111/91, pulse (!) 104, temperature 97.8 F (36.6 C), temperature source Oral, resp. rate 18, height 5\' 4"  (1.626 m), weight 67.2 kg, last menstrual period 07/31/2017, SpO2 99 %, unknown if currently breastfeeding. General: alert and no distress Lochia: appropriate Abdomen: soft, NT Uterine Fundus: firm Extremities: No evidence of DVT seen on physical exam. No lower extremity edema.  Discharge Instructions: Per After Visit Summary. Activity: Advance as tolerated. Pelvic rest for 6 weeks.  Also refer to After Visit Summary Diet: Regular Medications: Allergies as of 05/18/2018      Reactions   Latex Itching   Nickel Rash      Medication List    STOP taking these medications   Doxylamine-Pyridoxine ER 20-20 MG Tbcr Commonly known as:   BONJESTA     TAKE these medications   ferrous sulfate 325 (65 FE) MG tablet Take 325 mg by mouth daily with breakfast.   ibuprofen 600 MG tablet Commonly known as:  ADVIL,MOTRIN Take 1 tablet (600 mg total) by mouth every 6 (six) hours.   medroxyPROGESTERone 150 MG/ML injection Commonly known as:  DEPO-PROVERA Inject 1 mL (150 mg total) into the muscle every 3 (three) months.   VITAFOL GUMMIES 3.33-0.333-34.8 MG Chew Chew 3 Pieces by mouth daily.   Vitamin D3 125 MCG (5000 UT) Caps Take 1 capsule (5,000 Units total) by mouth daily.      Outpatient follow up:  Postpartum contraception: Depo-Provera  Discharged Condition: good  Discharged to: home   Newborn Data: Disposition:home with mother "Mercedes Potter"  Apgars: APGAR (1 MIN): 9   APGAR (5 MINS): 9   APGAR (10 MINS):    Baby Feeding: Breast  Chrisma Hurlock Suzan Nailer, CNM

## 2018-05-18 NOTE — Progress Notes (Signed)
Reviewed D/C instructions with pt and family. Pt verbalized understanding of teaching. Discharged to home via W/C. Pt to schedule f/u appt.  

## 2018-05-22 NOTE — Anesthesia Postprocedure Evaluation (Signed)
Anesthesia Post Note  Patient: Mercedes Potter  Procedure(s) Performed: AN AD HOC LABOR EPIDURAL  Anesthesia Type: Epidural Cardiovascular status: stable   Patient was discharged before being seen for post epidural visit. No problems related to epidural per charting.  Last Vitals: There were no vitals filed for this visit.  Last Pain: There were no vitals filed for this visit.               Jovita GammaKathryn L Fitzgerald

## 2018-06-03 ENCOUNTER — Ambulatory Visit (INDEPENDENT_AMBULATORY_CARE_PROVIDER_SITE_OTHER): Payer: Medicaid Other | Admitting: Certified Nurse Midwife

## 2018-06-03 ENCOUNTER — Ambulatory Visit: Payer: Medicaid Other

## 2018-06-03 VITALS — BP 125/85 | HR 88 | Ht 63.0 in | Wt 123.5 lb

## 2018-06-03 DIAGNOSIS — Z3042 Encounter for surveillance of injectable contraceptive: Secondary | ICD-10-CM

## 2018-06-03 MED ORDER — MEDROXYPROGESTERONE ACETATE 150 MG/ML IM SUSP
150.0000 mg | Freq: Once | INTRAMUSCULAR | Status: AC
  Administered 2018-06-03: 150 mg via INTRAMUSCULAR

## 2018-06-03 NOTE — Progress Notes (Signed)
Date last pap: na Last Depo-Provera: 1st injection Side Effects if any: na Serum HCG indicated? Na-pt had vaginal delivery 05/17/18 Depo-Provera 150 mg IM given by: Rosine Beat, CMA Next appointment due May 5-Sep 03, 2018 BP 125/85   Pulse 88   Ht 5\' 3"  (1.6 m)   Wt 123 lb 8 oz (56 kg)   LMP 07/31/2017   Breastfeeding Yes   BMI 21.88 kg/m

## 2018-06-28 ENCOUNTER — Encounter: Payer: Medicaid Other | Admitting: Certified Nurse Midwife

## 2018-08-21 ENCOUNTER — Ambulatory Visit: Payer: Medicaid Other

## 2018-08-22 ENCOUNTER — Other Ambulatory Visit: Payer: Self-pay

## 2018-08-22 MED ORDER — MEDROXYPROGESTERONE ACETATE 150 MG/ML IM SUSP: 150.0000 mg | INTRAMUSCULAR | 4 refills | Status: DC

## 2018-08-23 ENCOUNTER — Telehealth: Payer: Self-pay

## 2018-08-23 NOTE — Telephone Encounter (Signed)
Coronavirus (COVID-19) Are you at risk?  Are you at risk for the Coronavirus (COVID-19)?  To be considered HIGH RISK for Coronavirus (COVID-19), you have to meet the following criteria:  . Traveled to [place], [place], [place], [place] or [place]; or in the [place] to [place], [place], [place], or [place]; and have fever, cough, and shortness of breath within the last 2 weeks of travel OR . Been in close contact with a person diagnosed with COVID-19 within the last 2 weeks and have fever, cough, and shortness of breath . IF YOU DO NOT MEET THESE CRITERIA, YOU ARE CONSIDERED LOW RISK FOR COVID-19.  What to do if you are HIGH RISK for COVID-19?  . If you are having a medical emergency, call 911. . Seek medical care right away. Before you go to a doctor's office, urgent care or emergency department, call ahead and tell them about your recent travel, contact with someone diagnosed with COVID-19, and your symptoms. You should receive instructions from your physician's office regarding next steps of care.  . When you arrive at healthcare provider, tell the healthcare staff immediately you have returned from visiting [place], [place], [place], [place] or [place]; or traveled in the [place] to [place], [place], [place], or [place]; in the last two weeks or you have been in close contact with a person diagnosed with COVID-19 in the last 2 weeks.   . Tell the health care staff about your symptoms: fever, cough and shortness of breath. . After you have been seen by a medical provider, you will be either: o Tested for (COVID-19) and discharged home on quarantine except to seek medical care if symptoms worsen, and asked to  - Stay home and avoid contact with others until you get your results (4-5 days)  - Avoid travel on public transportation if possible (such as bus, train, or airplane) or o Sent to the Emergency Department by EMS for evaluation, COVID-19 testing, and possible  admission depending on your condition and test results.  What to do if you are LOW RISK for COVID-19?  Reduce your risk of any infection by using the same precautions used for avoiding the common cold or flu:  . Wash your hands often with soap and warm water for at least 20 seconds.  If soap and water are not readily available, use an alcohol-based hand sanitizer with at least 60% alcohol.  . If coughing or sneezing, cover your mouth and nose by coughing or sneezing into the elbow areas of your shirt or coat, into a tissue or into your sleeve (not your hands). . Avoid shaking hands with others and consider head nods or verbal greetings only. . Avoid touching your eyes, nose, or mouth with unwashed hands.  . Avoid close contact with people who are Renesmae Donahey. . Avoid places or events with large numbers of people in one location, like concerts or sporting events. . Carefully consider travel plans you have or are making. . If you are planning any travel outside or inside the [place], visit the CDC's Travelers' Health webpage for the latest health notices. . If you have some symptoms but not all symptoms, continue to monitor at home and seek medical attention if your symptoms worsen. . If you are having a medical emergency, call 911.  08/23/18 SCREENING NEG SLS ADDITIONAL HEALTHCARE OPTIONS FOR PATIENTS  [facility] / e-Visit: [email or web address]         [facility]: [phone]     Urgent Care: [phone]                   [facility]: [phone]  

## 2018-08-26 ENCOUNTER — Encounter: Payer: Self-pay | Admitting: Certified Nurse Midwife

## 2018-08-26 ENCOUNTER — Ambulatory Visit (INDEPENDENT_AMBULATORY_CARE_PROVIDER_SITE_OTHER): Payer: Medicaid Other | Admitting: Certified Nurse Midwife

## 2018-08-26 ENCOUNTER — Other Ambulatory Visit: Payer: Self-pay

## 2018-08-26 VITALS — BP 112/81 | HR 70 | Ht 64.0 in | Wt 123.0 lb

## 2018-08-26 DIAGNOSIS — Z3042 Encounter for surveillance of injectable contraceptive: Secondary | ICD-10-CM

## 2018-08-26 MED ORDER — MEDROXYPROGESTERONE ACETATE 150 MG/ML IM SUSP
150.0000 mg | Freq: Once | INTRAMUSCULAR | Status: AC
  Administered 2018-08-26: 150 mg via INTRAMUSCULAR

## 2018-08-26 NOTE — Progress Notes (Signed)
Pt refuses ppv , here for depo injection. Pt not seen by provider.

## 2018-08-26 NOTE — Progress Notes (Signed)
Date last pap:11/05/17. Last Depo-Provera: 06/03/18 Side Effects if any:na Serum HCG indicated? na. Depo-Provera 150 mg IM given by: Rosine Beat, CMA Next appointment due July 27- August 10,2020 BP 112/81   Pulse 70   Ht 5\' 4"  (1.626 m)   Wt 123 lb (55.8 kg)   Breastfeeding Yes   BMI 21.11 kg/m

## 2018-11-11 ENCOUNTER — Ambulatory Visit: Payer: Medicaid Other

## 2018-11-18 ENCOUNTER — Ambulatory Visit: Payer: Medicaid Other

## 2018-12-19 ENCOUNTER — Other Ambulatory Visit: Payer: Self-pay

## 2018-12-19 ENCOUNTER — Ambulatory Visit (INDEPENDENT_AMBULATORY_CARE_PROVIDER_SITE_OTHER): Payer: Medicaid Other | Admitting: Certified Nurse Midwife

## 2018-12-19 VITALS — BP 114/79 | HR 81 | Ht 64.0 in | Wt 125.4 lb

## 2018-12-19 DIAGNOSIS — Z3042 Encounter for surveillance of injectable contraceptive: Secondary | ICD-10-CM

## 2018-12-19 LAB — POCT URINE PREGNANCY: Preg Test, Ur: NEGATIVE

## 2018-12-19 MED ORDER — MEDROXYPROGESTERONE ACETATE 150 MG/ML IM SUSP
150.0000 mg | Freq: Once | INTRAMUSCULAR | Status: AC
  Administered 2018-12-19: 150 mg via INTRAMUSCULAR

## 2018-12-19 NOTE — Progress Notes (Signed)
Date last pap: 11/05/17 . Last Depo-Provera: 08/26/18. Side Effects if any: none. Serum HCG indicated? n/a. Depo-Provera 150 mg IM given by: Carren Rang, LPN. Next appointment due Nov 19- Mar 20, 2019.    BP 114/79   Pulse 81   Ht 5\' 4"  (1.626 m)   Wt 125 lb 6.4 oz (56.9 kg)   BMI 21.52 kg/m

## 2019-03-06 ENCOUNTER — Ambulatory Visit: Payer: Medicaid Other

## 2019-03-06 NOTE — Progress Notes (Deleted)
Last depo inj: 12/19/18 UPT: N/A Side effects: none Next Depo- Provera injection due: 05/22/19-06/05/19 Annual exam due: DUE

## 2019-04-01 ENCOUNTER — Telehealth: Payer: Self-pay

## 2019-04-01 NOTE — Telephone Encounter (Signed)
No action required.  Chart will be filed.

## 2019-04-01 NOTE — Telephone Encounter (Signed)
DEPO sent to [facility] 08/22/18 with 4 refills. Advised pt to contact pharm for refill. Pt has appt for injection Thursday, 04/03/19. Pt missed last depo appt.

## 2019-04-03 ENCOUNTER — Ambulatory Visit: Payer: Medicaid Other

## 2019-06-02 ENCOUNTER — Other Ambulatory Visit: Payer: Self-pay

## 2019-06-02 ENCOUNTER — Ambulatory Visit (INDEPENDENT_AMBULATORY_CARE_PROVIDER_SITE_OTHER): Payer: Medicaid Other | Admitting: Certified Nurse Midwife

## 2019-06-02 VITALS — BP 125/85 | HR 79 | Ht 64.0 in | Wt 123.7 lb

## 2019-06-02 DIAGNOSIS — Z3042 Encounter for surveillance of injectable contraceptive: Secondary | ICD-10-CM | POA: Diagnosis not present

## 2019-06-02 MED ORDER — MEDROXYPROGESTERONE ACETATE 150 MG/ML IM SUSP
150.0000 mg | Freq: Once | INTRAMUSCULAR | Status: AC
  Administered 2019-06-02: 150 mg via INTRAMUSCULAR

## 2019-06-02 NOTE — Progress Notes (Signed)
Date last pap: NA Last Depo-Provera: 12/19/2018 Side Effects if any: NA  Serum HCG indicated? UPT given Negative Depo-Provera 150 mg IM given by: [facility] CMA  Next appointment due May 3-May -17  Physical scheduled for 06/11/2019  Today's Vitals   06/02/19 1351  BP: 125/85  Pulse: 79  Weight: 123 lb 11.2 oz (56.1 kg)  Height: 5\' 4"  (1.626 m)   Body mass index is 21.23 kg/m.

## 2019-06-11 ENCOUNTER — Encounter: Payer: Medicaid Other | Admitting: Certified Nurse Midwife

## 2019-11-25 ENCOUNTER — Other Ambulatory Visit (HOSPITAL_COMMUNITY)
Admission: RE | Admit: 2019-11-25 | Discharge: 2019-11-25 | Disposition: A | Discharge status: Home / Self Care | Payer: Medicaid Other | Source: Ambulatory Visit | Attending: Certified Nurse Midwife | Admitting: Certified Nurse Midwife

## 2019-11-25 ENCOUNTER — Encounter: Payer: Self-pay | Admitting: Certified Nurse Midwife

## 2019-11-25 ENCOUNTER — Ambulatory Visit (INDEPENDENT_AMBULATORY_CARE_PROVIDER_SITE_OTHER): Payer: Medicaid Other | Admitting: Certified Nurse Midwife

## 2019-11-25 VITALS — BP 120/83 | HR 99 | Ht 64.0 in | Wt 114.5 lb

## 2019-11-25 DIAGNOSIS — R300 Dysuria: Secondary | ICD-10-CM | POA: Insufficient documentation

## 2019-11-25 DIAGNOSIS — Z01419 Encounter for gynecological examination (general) (routine) without abnormal findings: Secondary | ICD-10-CM | POA: Diagnosis not present

## 2019-11-25 DIAGNOSIS — F17200 Nicotine dependence, unspecified, uncomplicated: Secondary | ICD-10-CM

## 2019-11-25 DIAGNOSIS — Z113 Encounter for screening for infections with a predominantly sexual mode of transmission: Secondary | ICD-10-CM | POA: Diagnosis not present

## 2019-11-25 DIAGNOSIS — Z13220 Encounter for screening for lipoid disorders: Secondary | ICD-10-CM | POA: Diagnosis not present

## 2019-11-25 DIAGNOSIS — N898 Other specified noninflammatory disorders of vagina: Secondary | ICD-10-CM | POA: Insufficient documentation

## 2019-11-25 LAB — POCT URINE PREGNANCY: Preg Test, Ur: NEGATIVE

## 2019-11-25 MED ORDER — NORELGESTROMIN-ETH ESTRADIOL 150-35 MCG/24HR TD PTWK: 1.0000 | MEDICATED_PATCH | TRANSDERMAL | 12 refills | Status: DC

## 2019-11-25 NOTE — Patient Instructions (Signed)
Preventive Care 21-29 Years Old, Female Preventive care refers to visits with your health care provider and lifestyle choices that can promote health and wellness. This includes:  A yearly physical exam. This may also be called an annual well check.  Regular dental visits and eye exams.  Immunizations.  Screening for certain conditions.  Healthy lifestyle choices, such as eating a healthy diet, getting regular exercise, not using drugs or products that contain nicotine and tobacco, and limiting alcohol use. What can I expect for my preventive care visit? Physical exam Your health care provider will check your:  Height and weight. This may be used to calculate body mass index (BMI), which tells if you are at a healthy weight.  Heart rate and blood pressure.  Skin for abnormal spots. Counseling Your health care provider may ask you questions about your:  Alcohol, tobacco, and drug use.  Emotional well-being.  Home and relationship well-being.  Sexual activity.  Eating habits.  Work and work environment.  Method of birth control.  Menstrual cycle.  Pregnancy history. What immunizations do I need?  Influenza (flu) vaccine  This is recommended every year. Tetanus, diphtheria, and pertussis (Tdap) vaccine  You may need a Td booster every 10 years. Varicella (chickenpox) vaccine  You may need this if you have not been vaccinated. Human papillomavirus (HPV) vaccine  If recommended by your health care provider, you may need three doses over 6 months. Measles, mumps, and rubella (MMR) vaccine  You may need at least one dose of MMR. You may also need a second dose. Meningococcal conjugate (MenACWY) vaccine  One dose is recommended if you are age 19-21 years and a first-year college student living in a residence hall, or if you have one of several medical conditions. You may also need additional booster doses. Pneumococcal conjugate (PCV13) vaccine  You may need  this if you have certain conditions and were not previously vaccinated. Pneumococcal polysaccharide (PPSV23) vaccine  You may need one or two doses if you smoke cigarettes or if you have certain conditions. Hepatitis A vaccine  You may need this if you have certain conditions or if you travel or work in places where you may be exposed to hepatitis A. Hepatitis B vaccine  You may need this if you have certain conditions or if you travel or work in places where you may be exposed to hepatitis B. Haemophilus influenzae type b (Hib) vaccine  You may need this if you have certain conditions. You may receive vaccines as individual doses or as more than one vaccine together in one shot (combination vaccines). Talk with your health care provider about the risks and benefits of combination vaccines. What tests do I need?  Blood tests  Lipid and cholesterol levels. These may be checked every 5 years starting at age 20.  Hepatitis C test.  Hepatitis B test. Screening  Diabetes screening. This is done by checking your blood sugar (glucose) after you have not eaten for a while (fasting).  Sexually transmitted disease (STD) testing.  BRCA-related cancer screening. This may be done if you have a family history of breast, ovarian, tubal, or peritoneal cancers.  Pelvic exam and Pap test. This may be done every 3 years starting at age 21. Starting at age 30, this may be done every 5 years if you have a Pap test in combination with an HPV test. Talk with your health care provider about your test results, treatment options, and if necessary, the need for more tests.   Follow these instructions at home: Eating and drinking   Eat a diet that includes fresh fruits and vegetables, whole grains, lean protein, and low-fat dairy.  Take vitamin and mineral supplements as recommended by your health care provider.  Do not drink alcohol if: ? Your health care provider tells you not to drink. ? You are  pregnant, may be pregnant, or are planning to become pregnant.  If you drink alcohol: ? Limit how much you have to 0-1 drink a day. ? Be aware of how much alcohol is in your drink. In the [place] one drink equals one 12 oz bottle of beer (355 mL), one 5 oz glass of wine (148 mL), or one 1 oz glass of hard liquor (44 mL). Lifestyle  Take daily care of your teeth and gums.  Stay active. Exercise for at least 30 minutes on 5 or more days each week.  Do not use any products that contain nicotine or tobacco, such as cigarettes, e-cigarettes, and chewing tobacco. If you need help quitting, ask your health care provider.  If you are sexually active, practice safe sex. Use a condom or other form of birth control (contraception) in order to prevent pregnancy and STIs (sexually transmitted infections). If you plan to become pregnant, see your health care provider for a preconception visit. What's next?  Visit your health care provider once a year for a well check visit.  Ask your health care provider how often you should have your eyes and teeth checked.  Stay up to date on all vaccines. This information is not intended to replace advice given to you by your health care provider. Make sure you discuss any questions you have with your health care provider. Document Revised: 12/13/2017 Document Reviewed: 12/13/2017 Elsevier Patient Education  2020 [employer].

## 2019-11-25 NOTE — Progress Notes (Signed)
GYNECOLOGY ANNUAL PREVENTATIVE CARE ENCOUNTER NOTE  History:     Mercedes Potter is a 29 y.o. [phone] female here for a routine annual gynecologic exam.  Current complaints: mild burning with urination, vaginal odor.   Denies abnormal vaginal bleeding, discharge, pelvic pain, problems with intercourse or other gynecologic concerns.      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 No    Does the patient's partner want to become pregnant in the next year? No    Would the patient like to discuss contraceptive options today? No      Contraception Wrap Up   Contraception Counseling Provided No           Gynecologic History Patient's last menstrual period was 10/30/2019 (exact date). Contraception: none , stopped depo, did not like it  Last Pap: 11/06/17. Results were: normal with negative HPV Last mammogram: n/a  Obstetric History OB History  Gravida Para Term Preterm AB Living  3 2 2   1 2   SAB TAB Ectopic Multiple Live Births  1   0 0 2    # Outcome Date GA Lbr Len/2nd Weight Sex Delivery Anes PTL Lv  3 Term 05/17/18 [redacted]w[redacted]d / 00:24 7 lb 6.2 oz (3.35 kg) F Vag-Spont EPI  LIV  2 Term 12/06/07   7 lb 11 oz (3.487 kg) M Vag-Spont  N LIV  1 SAB             Past Medical History:  Diagnosis Date   Anemia     Past Surgical History:  Procedure Laterality Date   INNER EAR SURGERY Right     No current outpatient medications on file prior to visit.   No current facility-administered medications on file prior to visit.    Allergies  Allergen Reactions   Latex Itching   Nickel Rash    Social History:  reports that she has been smoking cigarettes. She has been smoking about 1.00  pack per day. She has never used smokeless tobacco. She reports current alcohol use. She reports previous drug use. Drug: Marijuana.  Family History  Problem Relation Age of Onset   Hypertension Father     The following portions of the patient's history were reviewed and updated as appropriate: allergies, current medications, past family history, past medical history, past social history, past surgical history and problem list.  Review of Systems Pertinent items noted in HPI and remainder of comprehensive ROS otherwise negative.  Physical Exam:  BP 120/83    Pulse 99    Ht 5\' 4"  (1.626 m)    Wt 114 lb 8 oz (51.9 kg)    LMP 10/30/2019 (Exact Date)    BMI 19.65 kg/m  CONSTITUTIONAL: Well-developed, well-nourished female in no acute distress.  HENT:  Normocephalic, atraumatic, External right and left ear normal. Oropharynx is clear and moist EYES: Conjunctivae and EOM are normal. Pupils are equal, round, and reactive to light. No scleral icterus.  NECK: Normal range of motion, supple, no masses.  Normal thyroid.  SKIN: Skin is warm and dry. No rash noted. Not diaphoretic. No erythema. No pallor. MUSCULOSKELETAL: Normal range of motion. No tenderness.  No cyanosis, clubbing,  or edema.  2+ distal pulses. NEUROLOGIC: Alert and oriented to person, place, and time. Normal reflexes, muscle tone coordination.  PSYCHIATRIC: Normal mood and affect. Normal behavior. Normal judgment and thought content. CARDIOVASCULAR: Normal heart rate noted, regular rhythm RESPIRATORY: Clear to auscultation bilaterally. Effort and breath sounds normal, no problems with respiration noted. BREASTS: Symmetric in size. No masses, tenderness, skin changes, nipple drainage, or lymphadenopathy bilaterally. ABDOMEN: Soft, no distention noted.  No tenderness, rebound or guarding.  PELVIC: Normal appearing external genitalia and urethral meatus; normal appearing vaginal mucosa and cervix.  No abnormal discharge noted.  Pap  smear obtained.  Normal uterine size, no other palpable masses, no uterine or adnexal tenderness.  Performed in the presence of a chaperone.   Assessment and Plan:    1. Dysuria - Urine Culture  2. Screening examination for STD (sexually transmitted disease) Hepatitis C Antibody  3. Screening, lipid - Lipid Profile 4. Vaginal odor - Cervicovaginal ancillary only  5. Women's annual routine gynecological examination  Pap smear not indicated Mammogram n/a Labs: lipid profile, hep c, vaginal swab, urine culture Referral: none Orders/refills: birth control patch  Routine preventative health maintenance measures emphasized. Please refer to After Visit Summary for other counseling recommendations.      Doreene Burke, CNM  [facility], [facility]

## 2019-11-25 NOTE — Addendum Note (Signed)
Addended by: Brooke Dare on: 11/25/2019 03:17 PM   Modules accepted: Orders

## 2019-11-26 LAB — HEPATITIS C ANTIBODY: Hep C Virus Ab: <0.1 s/co ratio (ref 0.0–0.9)

## 2019-11-26 LAB — LIPID PANEL
Chol/HDL Ratio: 4.2 ratio (ref 0.0–4.4)
Cholesterol, Total: 177 mg/dL (ref 100–199)
HDL: 42 mg/dL (ref >39)
LDL Chol Calc (NIH): 76 mg/dL (ref 0–99)
Triglycerides: 367 mg/dL — ABNORMAL HIGH (ref 0–149)
VLDL Cholesterol Cal: 59 mg/dL — ABNORMAL HIGH (ref 5–40)

## 2019-11-27 ENCOUNTER — Other Ambulatory Visit: Payer: Self-pay | Admitting: Certified Nurse Midwife

## 2019-11-27 LAB — CERVICOVAGINAL ANCILLARY ONLY
Bacterial Vaginitis (gardnerella): POSITIVE — AB
Candida Glabrata: NEGATIVE
Candida Vaginitis: POSITIVE — AB
Comment: NEGATIVE
Comment: NEGATIVE
Comment: NEGATIVE

## 2019-11-27 LAB — URINE CULTURE

## 2019-11-27 MED ORDER — METRONIDAZOLE 500 MG PO TABS: 500.0000 mg | ORAL_TABLET | Freq: Two times a day (BID) | ORAL | 0 refills | Status: AC

## 2019-11-27 MED ORDER — FLUCONAZOLE 150 MG PO TABS: 150.0000 mg | ORAL_TABLET | Freq: Once | ORAL | 0 refills | Status: AC

## 2020-11-02 ENCOUNTER — Telehealth: Payer: Medicaid Other | Admitting: Certified Nurse Midwife

## 2020-11-02 ENCOUNTER — Other Ambulatory Visit: Payer: Self-pay

## 2020-11-02 MED ORDER — NORELGESTROMIN-ETH ESTRADIOL 150-35 MCG/24HR TD PTWK: 1.0000 | MEDICATED_PATCH | TRANSDERMAL | 0 refills | Status: DC

## 2020-11-02 NOTE — Telephone Encounter (Signed)
Patient called and tried to have her birth control (ORTHO EVRA) filled at [facility] on [address]  Pharmacist stated that it was filled at [facility] in [facility] and that [facility] will not transfer the prescription.  Pharmacist at [facility] on [address] Requested we send a prescription so she can fill it for the patient since the patient is currently at the pharmacy.

## 2020-11-18 ENCOUNTER — Telehealth: Payer: Self-pay

## 2020-11-18 NOTE — Telephone Encounter (Signed)
Second attempt to call patient in regards to medication refill. Patient needs to schedule an appointment for her annual to have refills sent in. Patient does not have a working phone number and no answer from alternate phone.

## 2021-09-02 ENCOUNTER — Ambulatory Visit
Admission: EM | Admit: 2021-09-02 | Discharge: 2021-09-02 | Disposition: A | Discharge status: Home / Self Care | Payer: Medicaid Other | Attending: Emergency Medicine | Admitting: Emergency Medicine

## 2021-09-02 DIAGNOSIS — J020 Streptococcal pharyngitis: Secondary | ICD-10-CM | POA: Insufficient documentation

## 2021-09-02 LAB — GROUP A STREP BY PCR: Group A Strep by PCR: DETECTED — AB

## 2021-09-02 MED ORDER — IBUPROFEN 600 MG PO TABS: 600.0000 mg | ORAL_TABLET | Freq: Four times a day (QID) | ORAL | 0 refills | Status: AC | PRN

## 2021-09-02 MED ORDER — AMOXICILLIN 500 MG PO TABS: 500.0000 mg | ORAL_TABLET | Freq: Two times a day (BID) | ORAL | 0 refills | Status: AC

## 2021-09-02 NOTE — ED Triage Notes (Signed)
Patient is here for "chills yesterday with sorethroat, hurts bad, I see white patches, areas". No fever.

## 2021-09-02 NOTE — Discharge Instructions (Addendum)
Your strep was positive today.  Finish the amoxicillin, even if you feel better.  1 gram of Tylenol and 600 mg ibuprofen together 3-4 times a day as needed for pain.  Make sure you drink plenty of extra fluids.  Some people find salt water gargles and  Traditional Medicinal's "Throat Coat" tea helpful. Take 5 mL of liquid Benadryl and 5 mL of Maalox. Mix it together, and then hold it in your mouth for as long as you can and then swallow. You may do this 4 times a day.    Go to www.goodrx.com  or www.costplusdrugs.com to look up your medications. This will give you a list of where you can find your prescriptions at the most affordable prices. Or ask the pharmacist what the cash price is, or if they have any other discount programs available to help make your medication more affordable. This can be less expensive than what you would pay with insurance.

## 2021-09-02 NOTE — ED Provider Notes (Addendum)
HPI  SUBJECTIVE:  Patient reports sore throat starting yesterday. Sx worse with swallowing, talking.  Sx better with Excedrin, salt water gargles.  No fever + Chills + Swollen neck glands   No neck stiffness  + Cough No nasal congestion, rhinorrhea + Myalgias No Headache No Rash  No loss of taste or smell No shortness of breath or difficulty breathing No nausea, vomiting No diarrhea No abdominal pain     Both daughters are here today with similar symptoms No reflux sxs No Allergy sxs  No Breathing difficulty,  sensation of throat swelling shut + Muffled voice No Drooling No Trismus No abx in past month.  + antipyretic in past 4-6 hrs-Excedrin LMP: Last month.  Denies the possibility being pregnant PCP: [facility]   Past Medical History:  Diagnosis Date   Anemia     Past Surgical History:  Procedure Laterality Date   INNER EAR SURGERY Right     Family History  Problem Relation Age of Onset   Hypertension Father     Social History   Tobacco Use   Smoking status: Every Day    Packs/day: 1.00    Types: Cigarettes   Smokeless tobacco: Never  Vaping Use   Vaping Use: Never used  Substance Use Topics   Alcohol use: Yes    Comment: Wine on 04/04/2015 had home UPT   Drug use: Not Currently    Types: Marijuana    Comment: Last use was 02/2015    No current facility-administered medications for this encounter.  Current Outpatient Medications:    amoxicillin (AMOXIL) 500 MG tablet, Take 1 tablet (500 mg total) by mouth 2 (two) times daily for 10 days., Disp: 20 tablet, Rfl: 0   ibuprofen (ADVIL) 600 MG tablet, Take 1 tablet (600 mg total) by mouth every 6 (six) hours as needed., Disp: 30 tablet, Rfl: 0  Allergies  Allergen Reactions   Latex Itching   Nickel Rash     ROS  As noted in HPI.   Physical Exam  BP 123/82 (BP Location: Left Arm)   Pulse 90   Temp 98.3 F (36.8 C) (Oral)   Resp 16   Ht 5\' 3"  (1.6 m)   Wt  55.8 kg   LMP 08/14/2021 (Approximate)   SpO2 96%   BMI 21.79 kg/m   Constitutional: Well developed, well nourished, no acute distress.  Muffled voice. Eyes:  EOMI, conjunctiva normal bilaterally HENT: Normocephalic, atraumatic,mucus membranes moist.  Beefy red, large tonsils with exudates.  Positive petechiae on palate.  Uvula midline.  No drooling, trismus, stridor Respiratory: Normal inspiratory effort Cardiovascular: Normal rate, no murmurs, rubs, gallops GI: nondistended, nontender. No appreciable splenomegaly skin: No rash, skin intact Lymph: Positive anterior cervical LN.  No posterior cervical lymphadenopathy Musculoskeletal: no deformities Neurologic: Alert & oriented x 3, no focal neuro deficits Psychiatric: Speech and behavior appropriate.   ED Course   Medications - No data to display  Orders Placed This Encounter  Procedures   Group A Strep by PCR    Standing Status:   Standing    Number of Occurrences:   1    Order Specific Question:   Patient immune status    Answer:   Normal    Order Specific Question:   Release to patient    Answer:   Immediate    Results for orders placed or performed during the hospital encounter of 09/02/21 (from the past 24 hour(s))  Group A Strep by PCR  Status: Abnormal   Collection Time: 09/02/21  1:59 PM   Specimen: Throat; Sterile Swab  Result Value Ref Range   Group A Strep by PCR DETECTED (A) NOT DETECTED   No results found.  ED Clinical Impression  1. Strep pharyngitis      ED Assessment/Plan  strep PCR positive. Sending home with amoxicillin for 10 days.  Patient declined injection and dexamethasone. Home with ibuprofen, Tylenol, Benadryl/Maalox mixture. Patient to followup with PMD when necessary.  Discussed labs,  MDM, plan and followup with patient. Discussed sn/sx that should prompt return to the ED. patient agrees with plan.   Meds ordered this encounter  Medications   amoxicillin (AMOXIL) 500 MG tablet     Sig: Take 1 tablet (500 mg total) by mouth 2 (two) times daily for 10 days.    Dispense:  20 tablet    Refill:  0   ibuprofen (ADVIL) 600 MG tablet    Sig: Take 1 tablet (600 mg total) by mouth every 6 (six) hours as needed.    Dispense:  30 tablet    Refill:  0     *This clinic note was created using Scientist, clinical (histocompatibility and immunogenetics). Therefore, there may be occasional mistakes despite careful proofreading.     Domenick Gong, MD 09/02/21 1513    Domenick Gong, MD 09/02/21 [id number]

## 2022-07-04 ENCOUNTER — Other Ambulatory Visit: Payer: Self-pay | Admitting: Certified Nurse Midwife

## 2022-07-05 ENCOUNTER — Other Ambulatory Visit: Payer: Self-pay

## 2022-08-16 NOTE — Progress Notes (Deleted)
PCP:  Patient, No Pcp Per   No chief complaint on file.    HPI:      [name] is a 32 y.o. [id number] whose LMP was No LMP recorded., presents today for her annual examination.  Her menses are {norm/abn:715}, lasting {number: 22536} days.  Dysmenorrhea {dysmen:716}. She {does:18564} have intermenstrual bleeding.  Sex activity: {sex active: 315163}.  Last Pap: 11/05/17 Results were: no abnormalities /neg HPV DNA  Hx of STDs: {STD hx:14358}  There is no FH of breast cancer. There is no FH of ovarian cancer. The patient {does:18564} do self-breast exams.  Tobacco use: {tob:20664} Alcohol use: {Alcohol:11675} No drug use.  Exercise: {exercise:31265}  She {does:18564} get adequate calcium and Vitamin D in her diet.  Patient Active Problem List   Diagnosis Date Noted   Vaginal odor 11/25/2019   Dysuria 11/25/2019   Smoker 11/25/2019   History of marijuana use 10/26/2017    Past Surgical History:  Procedure Laterality Date   INNER EAR SURGERY Right     Family History  Problem Relation Age of Onset   Hypertension Father     Social History   Socioeconomic History   Marital status: Significant Other    Spouse name: Mercedes Potter   Number of children: Not on file   Years of education: Not on file   Highest education level: Not on file  Occupational History   Not on file  Tobacco Use   Smoking status: Every Day    Packs/day: 1    Types: Cigarettes   Smokeless tobacco: Never  Vaping Use   Vaping Use: Never used  Substance and Sexual Activity   Alcohol use: Yes    Comment: Wine on 04/04/2015 had home UPT   Drug use: Not Currently    Types: Marijuana    Comment: Last use was 02/2015   Sexual activity: Yes    Birth control/protection: None    Comment: planning depo  Other Topics Concern   Not on file  Social History Narrative   Not on file   Social Determinants of Health   Financial Resource Strain: Low Risk  (05/16/2018)   Overall Financial Resource  Strain (CARDIA)    Difficulty of Paying Living Expenses: Not hard at all  Food Insecurity: No Food Insecurity (05/16/2018)   Hunger Vital Sign    Worried About Running Out of Food in the Last Year: Never true    Ran Out of Food in the Last Year: Never true  Transportation Needs: No Transportation Needs (05/16/2018)   PRAPARE - Administrator, Civil Service (Medical): No    Lack of Transportation (Non-Medical): No  Physical Activity: Insufficiently Active (05/16/2018)   Exercise Vital Sign    Days of Exercise per Week: 3 days    Minutes of Exercise per Session: 30 min  Stress: No Stress Concern Present (05/16/2018)   [employer] of Occupational Health - Occupational Stress Questionnaire    Feeling of Stress : Only a little  Social Connections: Unknown (05/16/2018)   Social Connection and Isolation Panel [NHANES]    Frequency of Communication with Friends and Family: More than three times a week    Frequency of Social Gatherings with Friends and Family: More than three times a week    Attends Religious Services: 1 to 4 times per year    Active Member of [employer] or Organizations: No    Attends Banker Meetings: Never    Marital Status: Not on file  Intimate Partner Violence: Not on file     Current Outpatient Medications:    ibuprofen (ADVIL) 600 MG tablet, Take 1 tablet (600 mg total) by mouth every 6 (six) hours as needed., Disp: 30 tablet, Rfl: 0     ROS:  Review of Systems BREAST: No symptoms   Objective: There were no vitals taken for this visit.   OBGyn Exam  Results: No results found for this or any previous visit (from the past 24 hour(s)).  Assessment/Plan: No diagnosis found.  No orders of the defined types were placed in this encounter.            GYN counsel {counseling: 16159}     F/U  No follow-ups on file.  [place] B. Terren Haberle, PA-C 08/16/2022 1:45 PM

## 2022-08-17 ENCOUNTER — Ambulatory Visit: Payer: Medicaid Other | Admitting: Obstetrics and Gynecology

## 2022-08-17 DIAGNOSIS — Z1151 Encounter for screening for human papillomavirus (HPV): Secondary | ICD-10-CM

## 2022-08-17 DIAGNOSIS — Z01419 Encounter for gynecological examination (general) (routine) without abnormal findings: Secondary | ICD-10-CM

## 2022-08-17 DIAGNOSIS — Z124 Encounter for screening for malignant neoplasm of cervix: Secondary | ICD-10-CM
